# Patient Record
Sex: Female | Born: 1945 | Race: White | Hispanic: No | Smoking: Former smoker
Health system: Southern US, Community
[De-identification: ages and names within clinical notes are randomized; demographics above are authoritative.]

## PROBLEM LIST (undated history)

## (undated) DIAGNOSIS — M1990 Unspecified osteoarthritis, unspecified site: Secondary | ICD-10-CM

## (undated) DIAGNOSIS — E0590 Thyrotoxicosis, unspecified without thyrotoxic crisis or storm: Secondary | ICD-10-CM

## (undated) DIAGNOSIS — I10 Essential (primary) hypertension: Secondary | ICD-10-CM

## (undated) HISTORY — PX: HIP ARTHROSCOPY: SUR88

## (undated) HISTORY — PX: COLONOSCOPY: SHX174

## (undated) HISTORY — PX: KNEE ARTHROPLASTY: SHX992

## (undated) HISTORY — PX: OTHER SURGICAL HISTORY: SHX169

---

## 1997-12-31 ENCOUNTER — Encounter: Admission: RE | Admit: 1997-12-31 | Discharge: 1998-03-31 | Payer: Self-pay

## 1999-11-18 ENCOUNTER — Other Ambulatory Visit: Admission: RE | Admit: 1999-11-18 | Discharge: 1999-11-18 | Payer: Self-pay | Admitting: Obstetrics and Gynecology

## 2000-09-14 ENCOUNTER — Ambulatory Visit (HOSPITAL_BASED_OUTPATIENT_CLINIC_OR_DEPARTMENT_OTHER): Admission: RE | Admit: 2000-09-14 | Discharge: 2000-09-14 | Payer: Self-pay | Admitting: General Surgery

## 2000-12-19 ENCOUNTER — Other Ambulatory Visit: Admission: RE | Admit: 2000-12-19 | Discharge: 2000-12-19 | Payer: Self-pay | Admitting: Obstetrics and Gynecology

## 2002-01-21 ENCOUNTER — Encounter: Payer: Self-pay | Admitting: Obstetrics and Gynecology

## 2002-01-21 ENCOUNTER — Encounter: Admission: RE | Admit: 2002-01-21 | Discharge: 2002-01-21 | Payer: Self-pay | Admitting: Family Medicine

## 2002-03-07 ENCOUNTER — Other Ambulatory Visit: Admission: RE | Admit: 2002-03-07 | Discharge: 2002-03-07 | Payer: Self-pay | Admitting: Obstetrics and Gynecology

## 2003-01-17 ENCOUNTER — Encounter: Payer: Self-pay | Admitting: Emergency Medicine

## 2003-01-17 ENCOUNTER — Emergency Department (HOSPITAL_COMMUNITY): Admission: EM | Admit: 2003-01-17 | Discharge: 2003-01-17 | Payer: Self-pay | Admitting: Emergency Medicine

## 2003-05-27 ENCOUNTER — Other Ambulatory Visit: Admission: RE | Admit: 2003-05-27 | Discharge: 2003-05-27 | Payer: Self-pay | Admitting: Obstetrics and Gynecology

## 2004-06-16 ENCOUNTER — Encounter: Admission: RE | Admit: 2004-06-16 | Discharge: 2004-06-16 | Payer: Self-pay | Admitting: Family Medicine

## 2004-09-09 ENCOUNTER — Ambulatory Visit (HOSPITAL_COMMUNITY): Admission: RE | Admit: 2004-09-09 | Discharge: 2004-09-09 | Payer: Self-pay | Admitting: Gastroenterology

## 2005-12-05 ENCOUNTER — Encounter: Admission: RE | Admit: 2005-12-05 | Discharge: 2005-12-05 | Payer: Self-pay | Admitting: Orthopedic Surgery

## 2006-01-25 ENCOUNTER — Ambulatory Visit (HOSPITAL_COMMUNITY): Admission: RE | Admit: 2006-01-25 | Discharge: 2006-01-25 | Payer: Self-pay | Admitting: Orthopedic Surgery

## 2011-06-23 ENCOUNTER — Ambulatory Visit
Admission: RE | Admit: 2011-06-23 | Discharge: 2011-06-23 | Disposition: A | Discharge status: Home / Self Care | Payer: Medicare Other | Source: Ambulatory Visit | Attending: Family Medicine | Admitting: Family Medicine

## 2011-06-23 ENCOUNTER — Other Ambulatory Visit: Payer: Self-pay | Admitting: Family Medicine

## 2011-06-23 DIAGNOSIS — R52 Pain, unspecified: Secondary | ICD-10-CM

## 2014-09-17 ENCOUNTER — Other Ambulatory Visit: Payer: Self-pay | Admitting: Gastroenterology

## 2014-11-17 DIAGNOSIS — Z1231 Encounter for screening mammogram for malignant neoplasm of breast: Secondary | ICD-10-CM | POA: Diagnosis not present

## 2015-11-26 DIAGNOSIS — Z1231 Encounter for screening mammogram for malignant neoplasm of breast: Secondary | ICD-10-CM | POA: Diagnosis not present

## 2015-12-09 ENCOUNTER — Other Ambulatory Visit: Payer: Self-pay | Admitting: Family Medicine

## 2015-12-09 DIAGNOSIS — E049 Nontoxic goiter, unspecified: Secondary | ICD-10-CM

## 2015-12-13 ENCOUNTER — Ambulatory Visit
Admission: RE | Admit: 2015-12-13 | Discharge: 2015-12-13 | Disposition: A | Discharge status: Home / Self Care | Payer: Commercial Managed Care - HMO | Source: Ambulatory Visit | Attending: Family Medicine | Admitting: Family Medicine

## 2015-12-13 DIAGNOSIS — E049 Nontoxic goiter, unspecified: Secondary | ICD-10-CM

## 2015-12-15 ENCOUNTER — Other Ambulatory Visit: Payer: Self-pay | Admitting: Family Medicine

## 2015-12-15 DIAGNOSIS — E041 Nontoxic single thyroid nodule: Secondary | ICD-10-CM

## 2015-12-21 ENCOUNTER — Ambulatory Visit
Admission: RE | Admit: 2015-12-21 | Discharge: 2015-12-21 | Disposition: A | Discharge status: Home / Self Care | Payer: Commercial Managed Care - HMO | Source: Ambulatory Visit | Attending: Family Medicine | Admitting: Family Medicine

## 2015-12-21 ENCOUNTER — Other Ambulatory Visit (HOSPITAL_COMMUNITY)
Admission: RE | Admit: 2015-12-21 | Discharge: 2015-12-21 | Disposition: A | Discharge status: Home / Self Care | Payer: Commercial Managed Care - HMO | Source: Ambulatory Visit | Attending: Diagnostic Radiology | Admitting: Diagnostic Radiology

## 2015-12-21 DIAGNOSIS — E041 Nontoxic single thyroid nodule: Secondary | ICD-10-CM

## 2016-01-31 DIAGNOSIS — I10 Essential (primary) hypertension: Secondary | ICD-10-CM | POA: Diagnosis not present

## 2016-01-31 DIAGNOSIS — E785 Hyperlipidemia, unspecified: Secondary | ICD-10-CM | POA: Diagnosis not present

## 2016-01-31 DIAGNOSIS — Z0000 Encounter for general adult medical examination without abnormal findings: Secondary | ICD-10-CM | POA: Diagnosis not present

## 2016-01-31 DIAGNOSIS — Z1389 Encounter for screening for other disorder: Secondary | ICD-10-CM | POA: Diagnosis not present

## 2016-01-31 DIAGNOSIS — Z23 Encounter for immunization: Secondary | ICD-10-CM | POA: Diagnosis not present

## 2016-03-11 DIAGNOSIS — N390 Urinary tract infection, site not specified: Secondary | ICD-10-CM | POA: Diagnosis not present

## 2016-10-16 IMAGING — US US SOFT TISSUE HEAD/NECK
1 series · 14 of 25 positions shown · non-contrast
Comparison: None.

CLINICAL DATA: Thyroid goiter. Goiter extending into the
mediastinum based on outside CT.

EXAM:
THYROID ULTRASOUND
TECHNIQUE: Ultrasound examination of the thyroid gland and adjacent soft
tissues was performed.

[Series 1: us soft tissue head/neck · 0.08mm/px · 14 of 43 slices shown]
[im 1/43]
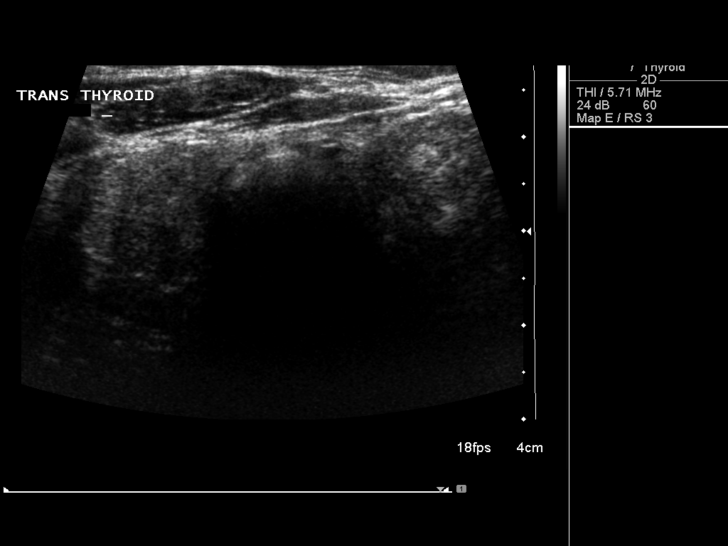
[im 4/43]
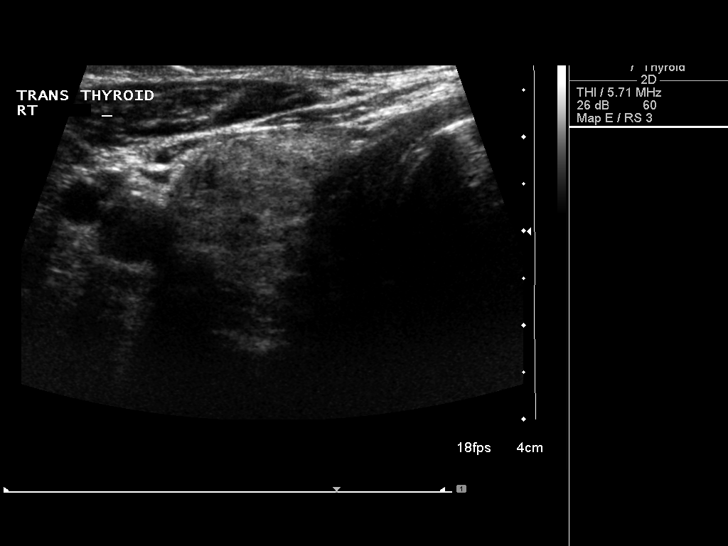
[im 8/43]
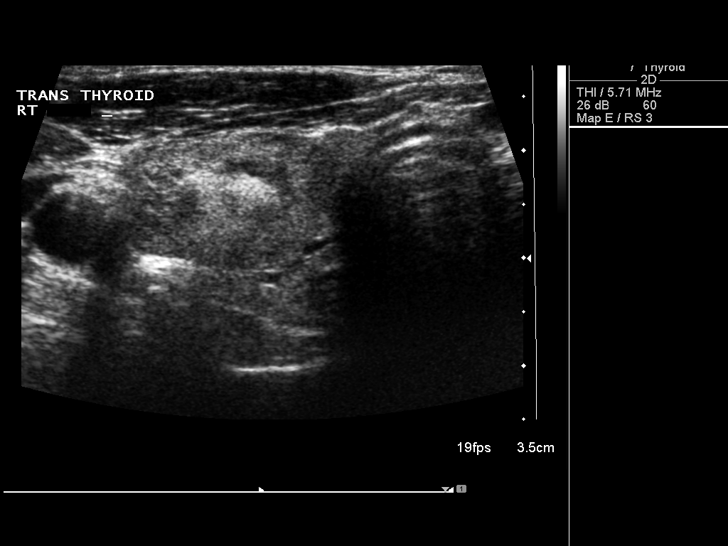
[im 11/43]
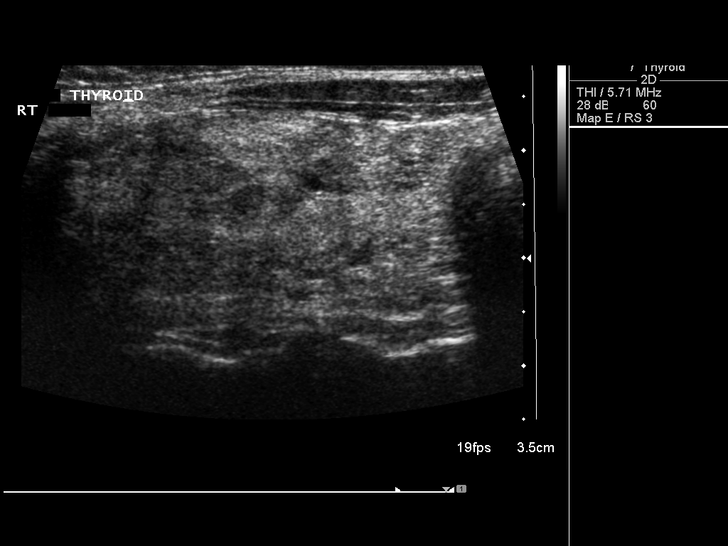
[im 15/43]
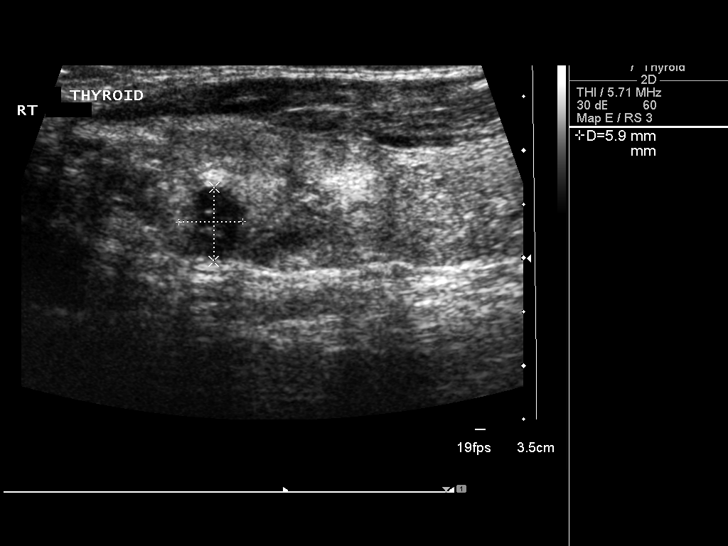
[im 16/43]
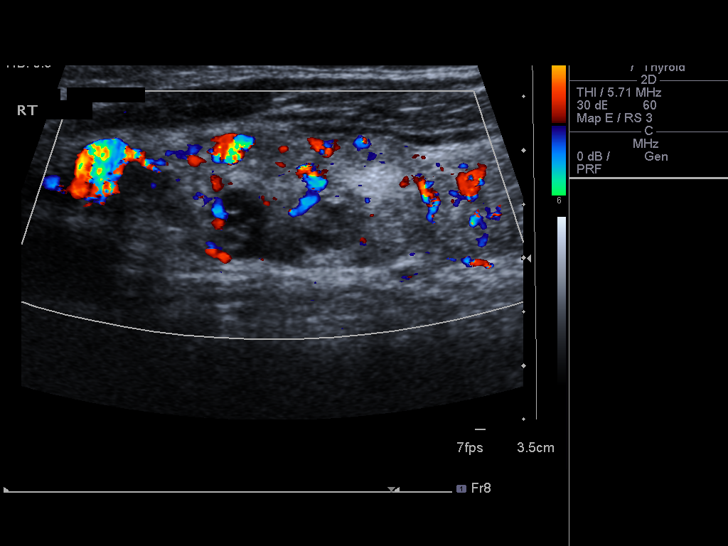
[im 20/43]
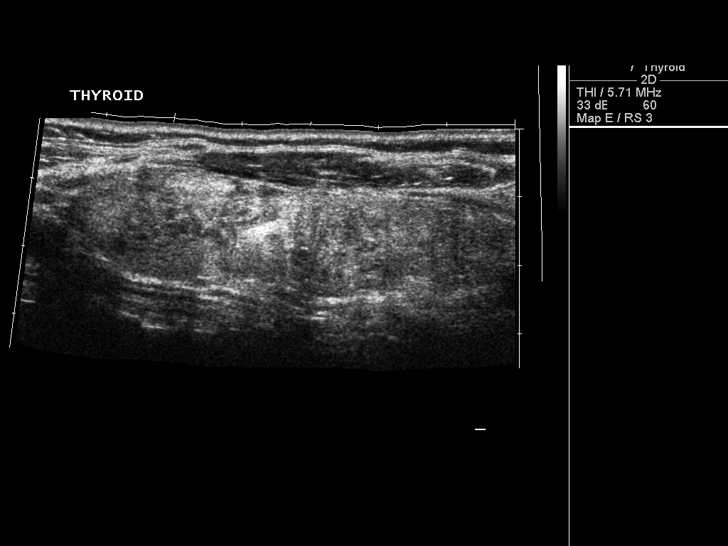
[im 23/43]
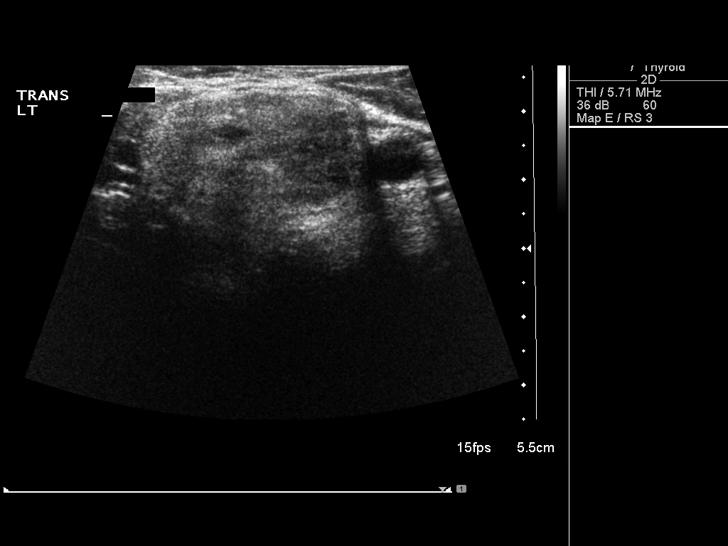
[im 27/43]
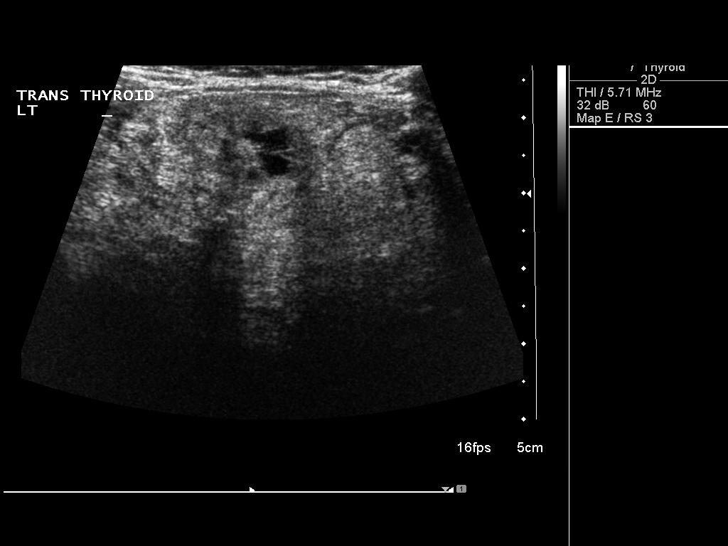
[im 29/43]
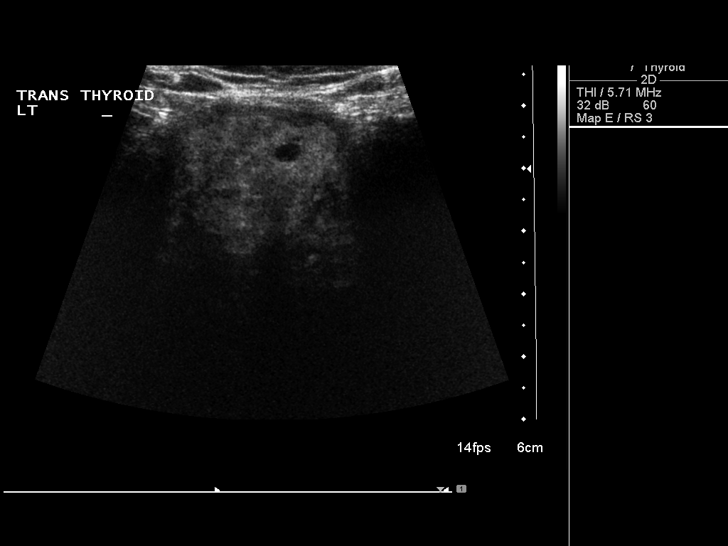
[im 32/43]
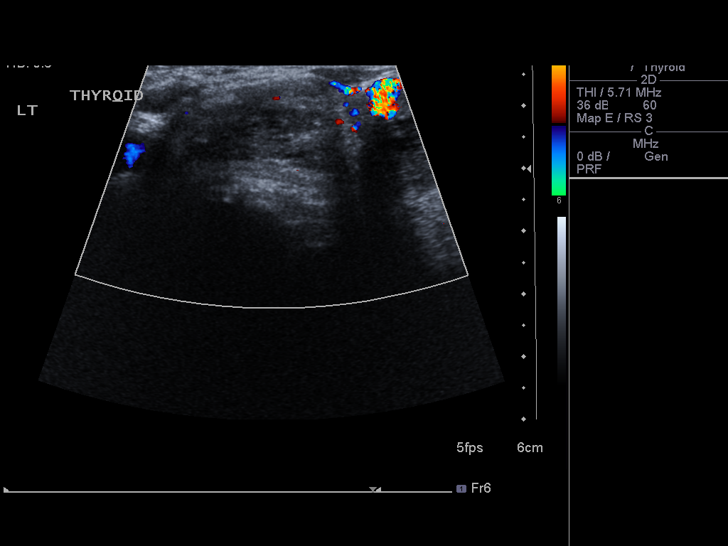
[im 36/43]
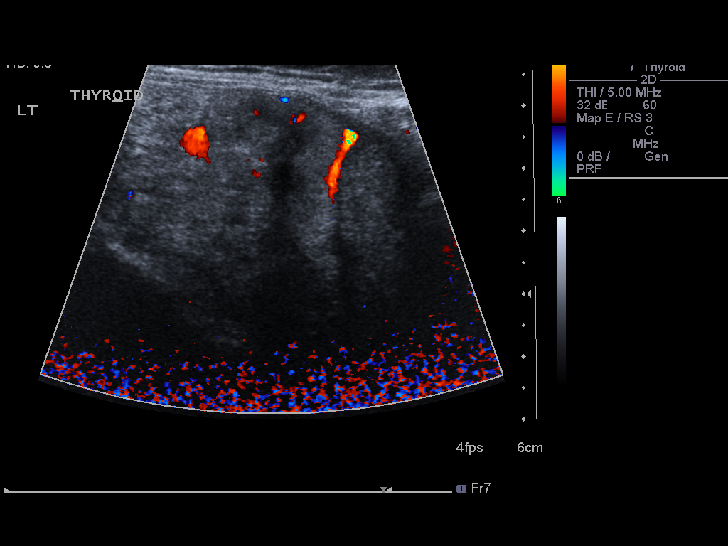
[im 39/43]
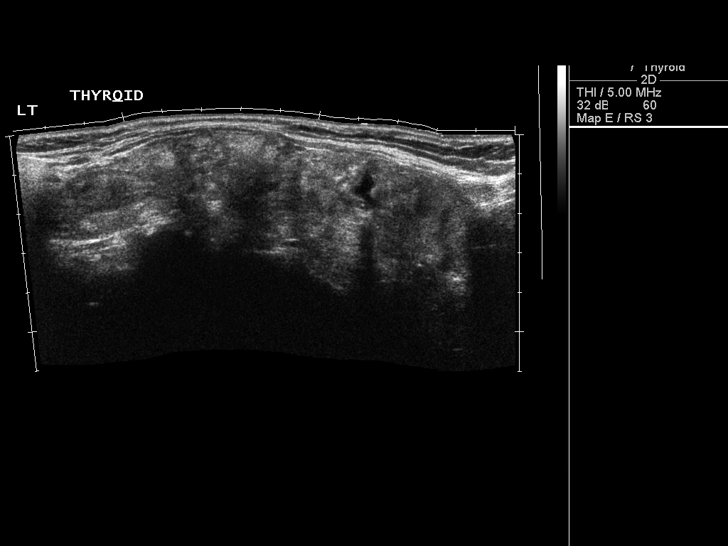
[im 43/43]
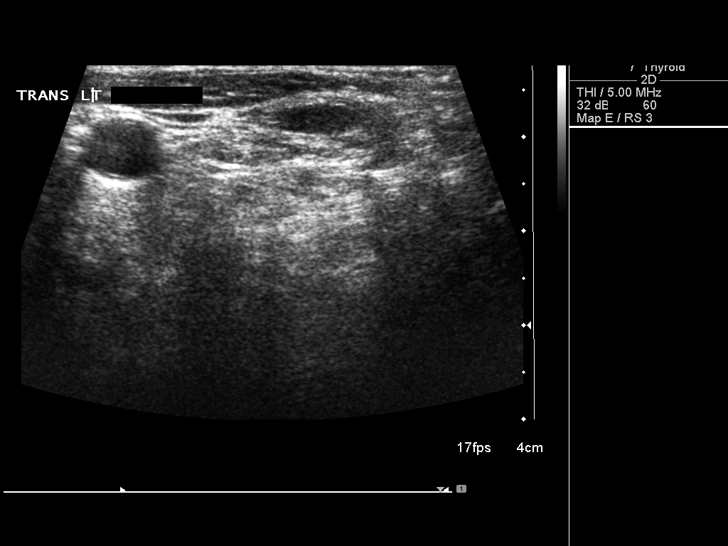

[14 of 25 positions shown; findings below may reference images not displayed]

FINDINGS: Right thyroid lobe

Measurements: 6.6 x 1.7 x 2.3 cm. Right thyroid tissue is diffusely
heterogeneous. There is a heterogeneous structure in the mid right
thyroid lobe which could represent a complex cyst measuring 0.6 x
0.6 x 0.7 cm.

Left thyroid lobe

Measurements: 11.3 x 4.3 x 5.2 cm. Left thyroid tissue is diffusely
heterogeneous and may be replaced by a large nodule or
conglomeration of nodules.

Isthmus

Thickness: 0.6 cm.  Heterogeneous without a discrete nodule.

Lymphadenopathy

None visualized.
IMPRESSION: Thyroid tissue is diffusely enlarged and heterogeneous, particularly
the left thyroid lobe. Left thyroid lobe may be replaced by nodules.
Consider ultrasound-guided biopsy of the left thyroid lobe.

## 2016-10-24 IMAGING — US US THYROID BIOPSY
1 series · 13 of 15 positions shown · non-contrast
Comparison: none

INDICATION: 70-year-old with an enlarged left thyroid lobe.

[Series 1: us thyroid biopsy · 0.09mm/px · 15 acquisitions, 13 frames shown]
[im 1/15]
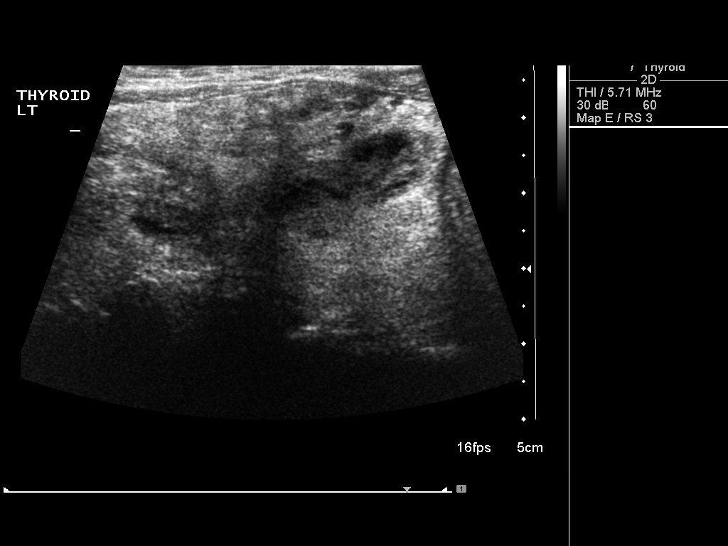
[im 2/15]
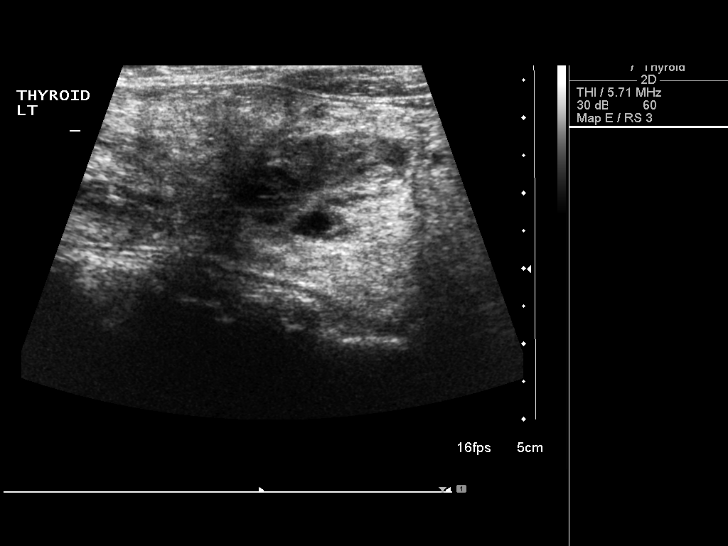
[im 3/15]
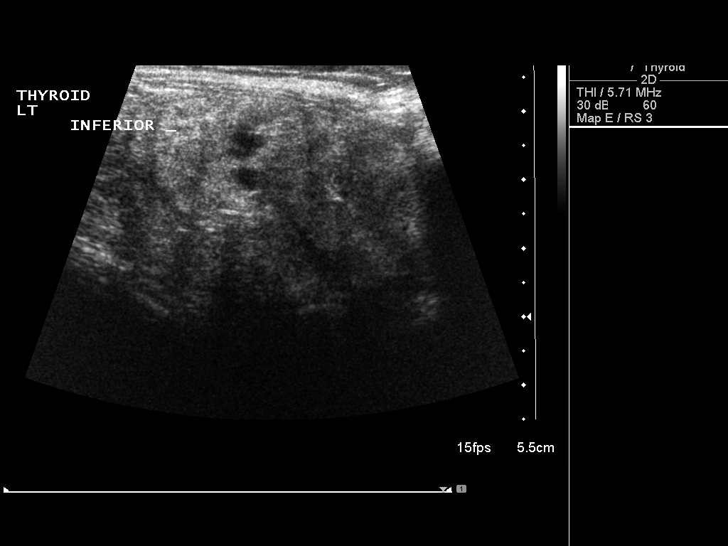
[im 5/15]
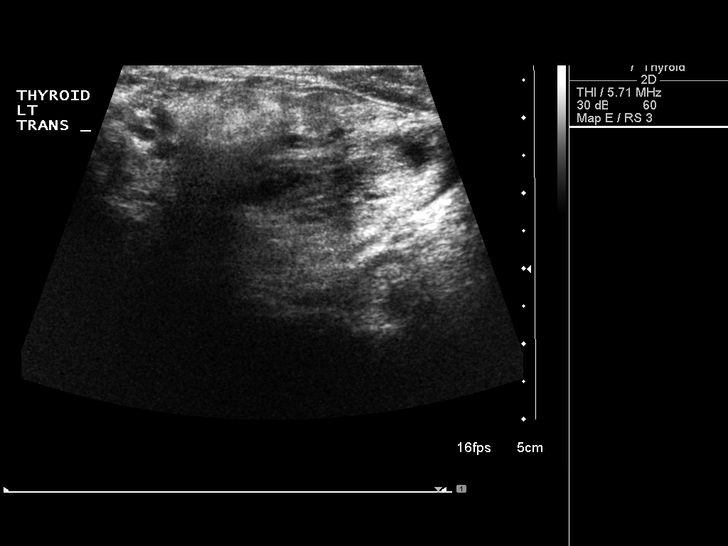
[im 6/15]
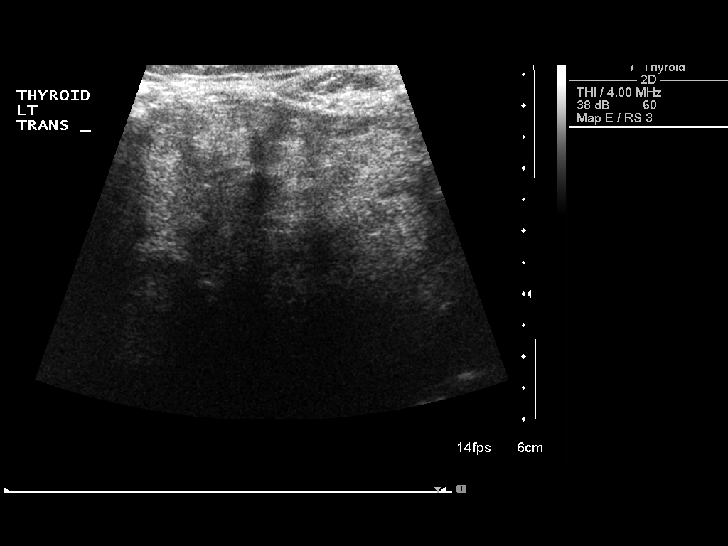
[im 7/15]
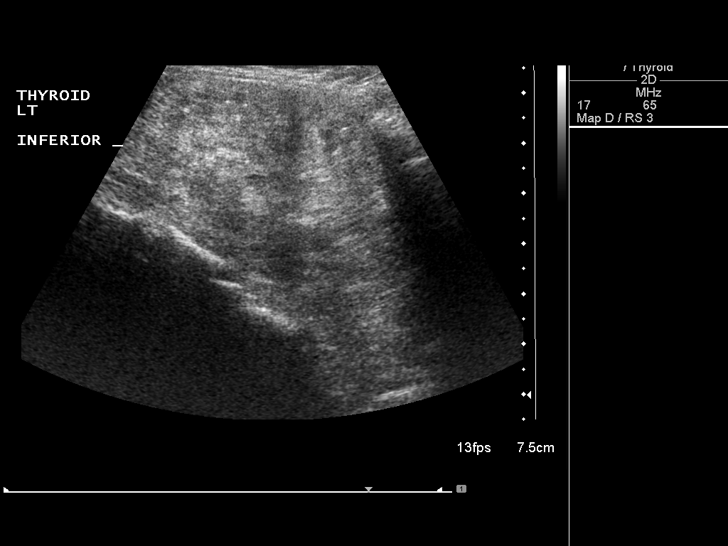
[im 8/15]
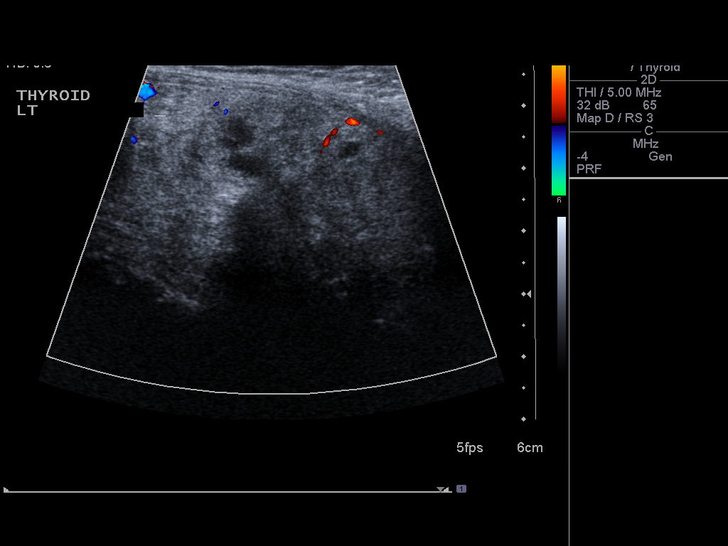
[im 9/15]
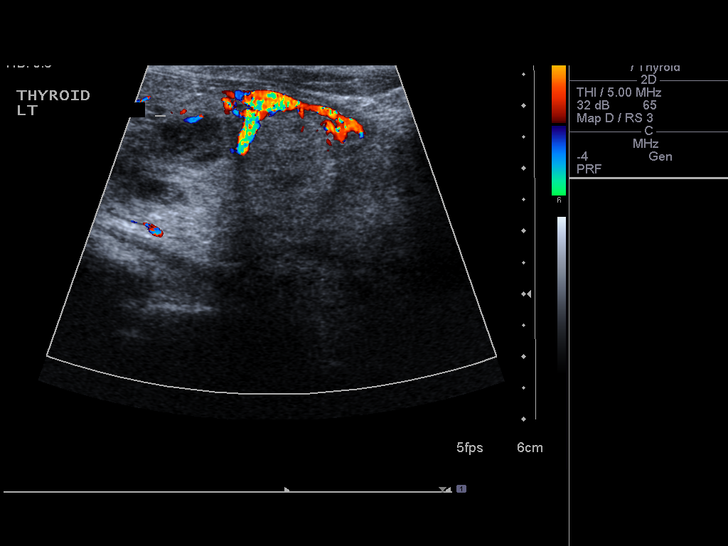
[im 10/15]
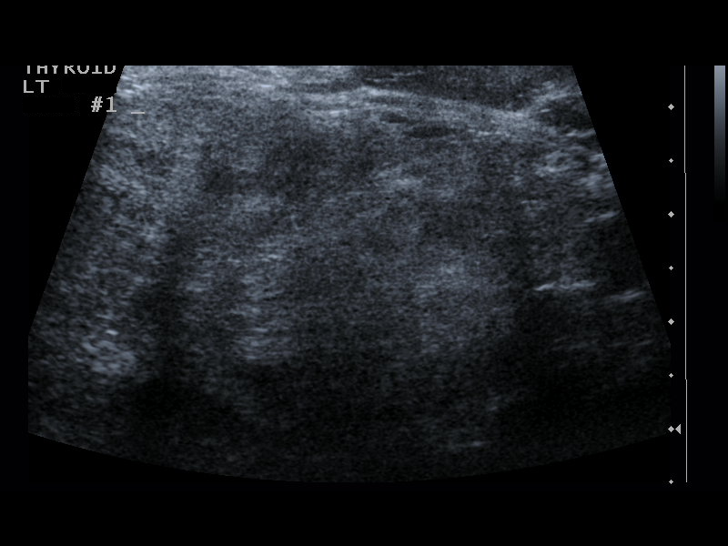
[im 11/15]
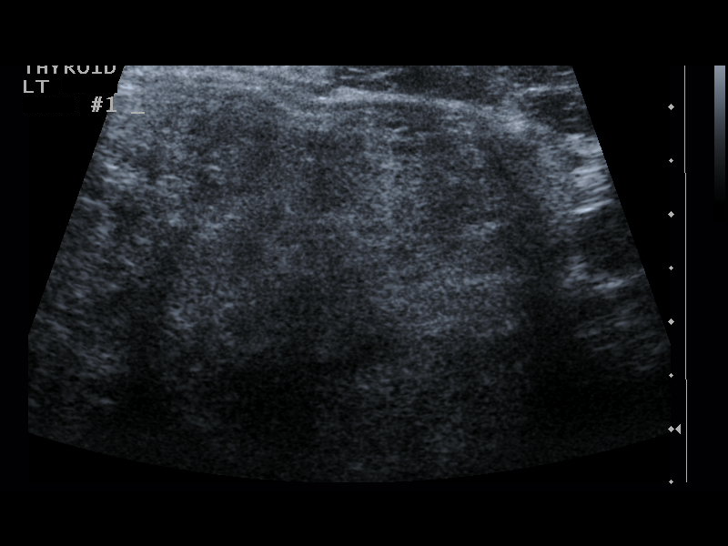
[im 13/15]
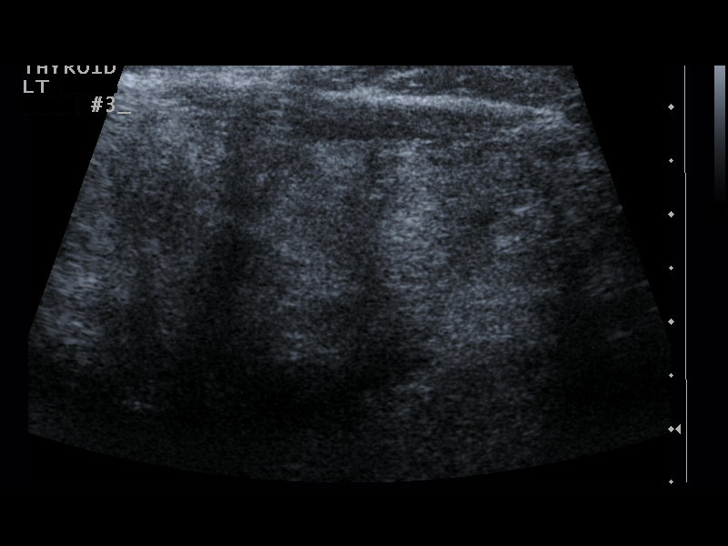
[im 14/15]
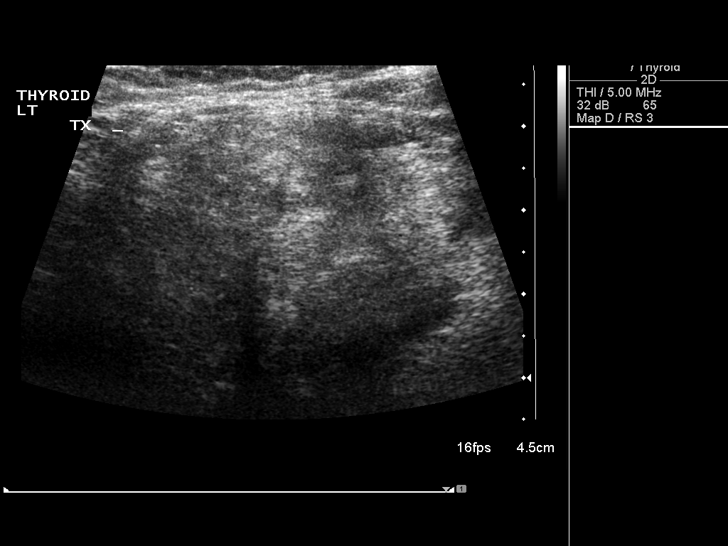
[im 15/15]
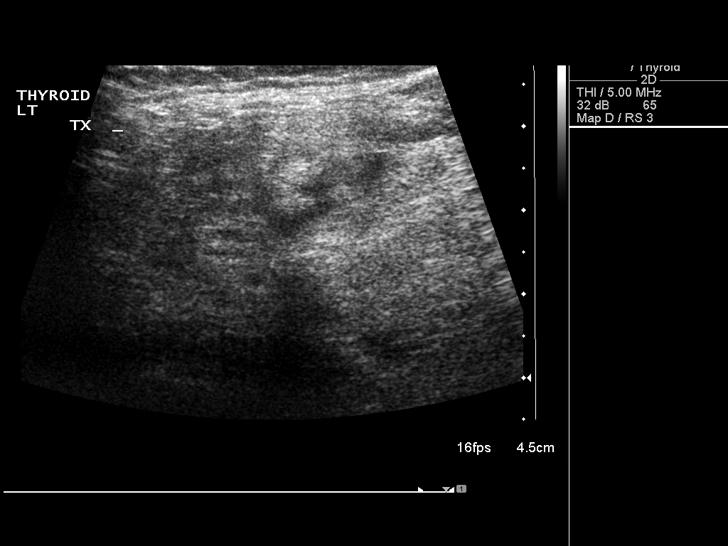

[13 of 15 positions shown; findings below may reference images not displayed]

EXAM:
ULTRASOUND GUIDED NEEDLE ASPIRATE BIOPSY OF THE THYROID GLAND

MEDICATIONS:
None.

ANESTHESIA/SEDATION:
None

FLUOROSCOPY TIME:  None

COMPLICATIONS:
None immediate.

PROCEDURE:
The procedure was explained to the patient. The risks and benefits
of the procedure were discussed and the patient's questions were
addressed. Informed consent was obtained from the patient.

Ultrasound was performed to localize and mark an adequate site for
the biopsy. The patient was then prepped and draped in a normal
sterile fashion. Local anesthesia was provided with 1% lidocaine.
Using direct ultrasound guidance, 4 passes were made using needles
into the left lobe of the thyroid. Ultrasound was used to confirm
needle placements on all occasions. Specimens were sent to Pathology
for analysis.
FINDINGS: Left thyroid lobe is diffusely heterogeneous and enlarged. Unclear
if the left thyroid lobe is diffusely heterogeneous or replaced by
multiple nodules. Biopsy samples were obtained from the more
inferior aspect of the left thyroid lobe.
IMPRESSION: Ultrasound-guided fine-needle aspiration of the left thyroid lobe.

## 2016-11-21 DIAGNOSIS — R0789 Other chest pain: Secondary | ICD-10-CM | POA: Diagnosis not present

## 2016-11-21 DIAGNOSIS — R112 Nausea with vomiting, unspecified: Secondary | ICD-10-CM | POA: Diagnosis not present

## 2016-11-22 ENCOUNTER — Inpatient Hospital Stay (HOSPITAL_COMMUNITY)
Admission: EM | Admit: 2016-11-22 | Discharge: 2016-11-24 | DRG: 419 | Disposition: A | Discharge status: Home / Self Care | Payer: Medicare HMO | Attending: General Surgery | Admitting: General Surgery

## 2016-11-22 ENCOUNTER — Observation Stay (HOSPITAL_COMMUNITY): Payer: Medicare HMO | Admitting: Anesthesiology

## 2016-11-22 ENCOUNTER — Emergency Department (HOSPITAL_COMMUNITY): Payer: Medicare HMO

## 2016-11-22 ENCOUNTER — Encounter: Payer: Self-pay | Admitting: Emergency Medicine

## 2016-11-22 ENCOUNTER — Encounter (HOSPITAL_COMMUNITY): Admission: EM | Disposition: A | Discharge status: Home / Self Care | Payer: Self-pay | Source: Home / Self Care

## 2016-11-22 DIAGNOSIS — Z882 Allergy status to sulfonamides status: Secondary | ICD-10-CM | POA: Diagnosis not present

## 2016-11-22 DIAGNOSIS — K8012 Calculus of gallbladder with acute and chronic cholecystitis without obstruction: Secondary | ICD-10-CM | POA: Diagnosis not present

## 2016-11-22 DIAGNOSIS — K8066 Calculus of gallbladder and bile duct with acute and chronic cholecystitis without obstruction: Secondary | ICD-10-CM | POA: Diagnosis not present

## 2016-11-22 DIAGNOSIS — Z87891 Personal history of nicotine dependence: Secondary | ICD-10-CM

## 2016-11-22 DIAGNOSIS — R1011 Right upper quadrant pain: Secondary | ICD-10-CM

## 2016-11-22 DIAGNOSIS — I10 Essential (primary) hypertension: Secondary | ICD-10-CM | POA: Diagnosis present

## 2016-11-22 DIAGNOSIS — Z96649 Presence of unspecified artificial hip joint: Secondary | ICD-10-CM | POA: Diagnosis present

## 2016-11-22 DIAGNOSIS — K810 Acute cholecystitis: Principal | ICD-10-CM | POA: Diagnosis present

## 2016-11-22 DIAGNOSIS — Z79899 Other long term (current) drug therapy: Secondary | ICD-10-CM

## 2016-11-22 DIAGNOSIS — K8020 Calculus of gallbladder without cholecystitis without obstruction: Secondary | ICD-10-CM | POA: Diagnosis not present

## 2016-11-22 DIAGNOSIS — Z888 Allergy status to other drugs, medicaments and biological substances status: Secondary | ICD-10-CM | POA: Diagnosis not present

## 2016-11-22 DIAGNOSIS — K819 Cholecystitis, unspecified: Secondary | ICD-10-CM | POA: Diagnosis present

## 2016-11-22 HISTORY — DX: Essential (primary) hypertension: I10

## 2016-11-22 HISTORY — PX: CHOLECYSTECTOMY: SHX55

## 2016-11-22 LAB — URINALYSIS, ROUTINE W REFLEX MICROSCOPIC
GLUCOSE, UA: 50 mg/dL — AB
KETONES UR: NEGATIVE mg/dL
LEUKOCYTES UA: NEGATIVE
Nitrite: NEGATIVE
PH: 5 (ref 5.0–8.0)
Protein, ur: 100 mg/dL — AB
Specific Gravity, Urine: 1.02 (ref 1.005–1.030)

## 2016-11-22 LAB — COMPREHENSIVE METABOLIC PANEL
ALT: 16 U/L (ref 14–54)
AST: 18 U/L (ref 15–41)
Albumin: 3.2 g/dL — ABNORMAL LOW (ref 3.5–5.0)
Alkaline Phosphatase: 46 U/L (ref 38–126)
Anion gap: 13 (ref 5–15)
BUN: 24 mg/dL — AB (ref 6–20)
CHLORIDE: 99 mmol/L — AB (ref 101–111)
CO2: 22 mmol/L (ref 22–32)
CREATININE: 1.27 mg/dL — AB (ref 0.44–1.00)
Calcium: 8.9 mg/dL (ref 8.9–10.3)
GFR calc Af Amer: 48 mL/min — ABNORMAL LOW (ref >60)
GFR calc non Af Amer: 41 mL/min — ABNORMAL LOW (ref >60)
GLUCOSE: 139 mg/dL — AB (ref 65–99)
POTASSIUM: 3.5 mmol/L (ref 3.5–5.1)
SODIUM: 134 mmol/L — AB (ref 135–145)
Total Bilirubin: 1 mg/dL (ref 0.3–1.2)
Total Protein: 6.6 g/dL (ref 6.5–8.1)

## 2016-11-22 LAB — CBC
HEMATOCRIT: 38.5 % (ref 36.0–46.0)
Hemoglobin: 13.2 g/dL (ref 12.0–15.0)
MCH: 28.9 pg (ref 26.0–34.0)
MCHC: 34.3 g/dL (ref 30.0–36.0)
MCV: 84.2 fL (ref 78.0–100.0)
Platelets: 477 10*3/uL — ABNORMAL HIGH (ref 150–400)
RBC: 4.57 MIL/uL (ref 3.87–5.11)
RDW: 13.3 % (ref 11.5–15.5)
WBC: 19.8 10*3/uL — AB (ref 4.0–10.5)

## 2016-11-22 LAB — LIPASE, BLOOD: LIPASE: 18 U/L (ref 11–51)

## 2016-11-22 SURGERY — LAPAROSCOPIC CHOLECYSTECTOMY
Anesthesia: General | Site: Abdomen

## 2016-11-22 MED ORDER — MORPHINE SULFATE (PF) 4 MG/ML IV SOLN: 2.0000 mg | INTRAVENOUS | Status: DC | PRN

## 2016-11-22 MED ORDER — ONDANSETRON HCL 4 MG/2ML IJ SOLN
INTRAMUSCULAR | Status: AC
  Filled 2016-11-22: qty 2

## 2016-11-22 MED ORDER — SCOPOLAMINE 1 MG/3DAYS TD PT72
1.0000 | MEDICATED_PATCH | TRANSDERMAL | Status: AC
  Administered 2016-11-22: 1 via TRANSDERMAL
  Filled 2016-11-22: qty 1

## 2016-11-22 MED ORDER — SODIUM CHLORIDE 0.9 % IV SOLN: INTRAVENOUS | Status: DC

## 2016-11-22 MED ORDER — ONDANSETRON 4 MG PO TBDP: 4.0000 mg | ORAL_TABLET | Freq: Four times a day (QID) | ORAL | Status: DC | PRN

## 2016-11-22 MED ORDER — ONDANSETRON HCL 4 MG/2ML IJ SOLN
4.0000 mg | Freq: Once | INTRAMUSCULAR | Status: AC
  Administered 2016-11-22: 4 mg via INTRAVENOUS
  Filled 2016-11-22: qty 2

## 2016-11-22 MED ORDER — FENTANYL CITRATE (PF) 100 MCG/2ML IJ SOLN
INTRAMUSCULAR | Status: AC
  Filled 2016-11-22: qty 4

## 2016-11-22 MED ORDER — ONDANSETRON HCL 4 MG/2ML IJ SOLN: 4.0000 mg | Freq: Four times a day (QID) | INTRAMUSCULAR | Status: DC | PRN

## 2016-11-22 MED ORDER — HYDRALAZINE HCL 20 MG/ML IJ SOLN
10.0000 mg | INTRAMUSCULAR | Status: DC | PRN
Start: 2016-11-22 — End: 2016-11-24

## 2016-11-22 MED ORDER — LACTATED RINGERS IV SOLN
INTRAVENOUS | Status: DC | PRN
Start: 2016-11-22 — End: 2016-11-22
  Administered 2016-11-22: 17:00:00 via INTRAVENOUS

## 2016-11-22 MED ORDER — FENTANYL CITRATE (PF) 100 MCG/2ML IJ SOLN
INTRAMUSCULAR | Status: AC
  Filled 2016-11-22: qty 2

## 2016-11-22 MED ORDER — PROPOFOL 10 MG/ML IV BOLUS
INTRAVENOUS | Status: AC
  Filled 2016-11-22: qty 20

## 2016-11-22 MED ORDER — PHENYLEPHRINE HCL 10 MG/ML IJ SOLN
INTRAMUSCULAR | Status: DC | PRN
  Administered 2016-11-22 (×3): 80 ug via INTRAVENOUS

## 2016-11-22 MED ORDER — IOPAMIDOL (ISOVUE-300) INJECTION 61%
INTRAVENOUS | Status: AC
  Filled 2016-11-22: qty 50

## 2016-11-22 MED ORDER — PHENYLEPHRINE HCL 10 MG/ML IJ SOLN
INTRAVENOUS | Status: DC | PRN
  Administered 2016-11-22: 50 ug/min via INTRAVENOUS

## 2016-11-22 MED ORDER — BUPIVACAINE-EPINEPHRINE (PF) 0.5% -1:200000 IJ SOLN
INTRAMUSCULAR | Status: DC | PRN
  Administered 2016-11-22: 20 mL via PERINEURAL

## 2016-11-22 MED ORDER — PROMETHAZINE HCL 25 MG/ML IJ SOLN: 6.2500 mg | INTRAMUSCULAR | Status: DC | PRN

## 2016-11-22 MED ORDER — MIDAZOLAM HCL 2 MG/2ML IJ SOLN: 0.5000 mg | Freq: Once | INTRAMUSCULAR | Status: DC | PRN

## 2016-11-22 MED ORDER — HYDROMORPHONE HCL 1 MG/ML IJ SOLN
INTRAMUSCULAR | Status: AC
Start: 2016-11-22 — End: 2016-11-23
  Filled 2016-11-22: qty 0.5

## 2016-11-22 MED ORDER — DEXTROSE 5 % IV SOLN: 2.0000 g | INTRAVENOUS | Status: DC

## 2016-11-22 MED ORDER — SUGAMMADEX SODIUM 200 MG/2ML IV SOLN
INTRAVENOUS | Status: DC | PRN
Start: 2016-11-22 — End: 2016-11-22
  Administered 2016-11-22: 150 mg via INTRAVENOUS

## 2016-11-22 MED ORDER — PIPERACILLIN-TAZOBACTAM 3.375 G IVPB
3.3750 g | INTRAVENOUS | Status: AC
  Administered 2016-11-22: 3.375 g via INTRAVENOUS
  Filled 2016-11-22: qty 50

## 2016-11-22 MED ORDER — ACETAMINOPHEN 325 MG PO TABS
650.0000 mg | ORAL_TABLET | Freq: Four times a day (QID) | ORAL | Status: DC | PRN
Start: 2016-11-22 — End: 2016-11-24

## 2016-11-22 MED ORDER — LIDOCAINE 2% (20 MG/ML) 5 ML SYRINGE
INTRAMUSCULAR | Status: DC | PRN
  Administered 2016-11-22: 100 mg via INTRAVENOUS

## 2016-11-22 MED ORDER — SODIUM CHLORIDE 0.9 % IV BOLUS (SEPSIS)
1000.0000 mL | Freq: Once | INTRAVENOUS | Status: AC
  Administered 2016-11-22: 1000 mL via INTRAVENOUS

## 2016-11-22 MED ORDER — DIPHENHYDRAMINE HCL 50 MG/ML IJ SOLN: 12.5000 mg | Freq: Four times a day (QID) | INTRAMUSCULAR | Status: DC | PRN

## 2016-11-22 MED ORDER — FENTANYL CITRATE (PF) 100 MCG/2ML IJ SOLN
INTRAMUSCULAR | Status: DC | PRN
  Administered 2016-11-22 (×2): 100 ug via INTRAVENOUS
  Administered 2016-11-22: 25 ug via INTRAVENOUS
  Administered 2016-11-22: 100 ug via INTRAVENOUS

## 2016-11-22 MED ORDER — SUGAMMADEX SODIUM 200 MG/2ML IV SOLN
INTRAVENOUS | Status: AC
  Filled 2016-11-22: qty 2

## 2016-11-22 MED ORDER — SCOPOLAMINE 1 MG/3DAYS TD PT72
MEDICATED_PATCH | TRANSDERMAL | Status: AC
  Filled 2016-11-22: qty 1

## 2016-11-22 MED ORDER — SODIUM CHLORIDE 0.9 % IV SOLN
INTRAVENOUS | Status: DC
  Administered 2016-11-22: 16:00:00 via INTRAVENOUS
  Administered 2016-11-22: 150 mL/h via INTRAVENOUS

## 2016-11-22 MED ORDER — DIPHENHYDRAMINE HCL 12.5 MG/5ML PO ELIX: 12.5000 mg | ORAL_SOLUTION | Freq: Four times a day (QID) | ORAL | Status: DC | PRN

## 2016-11-22 MED ORDER — PIPERACILLIN-TAZOBACTAM 3.375 G IVPB
3.3750 g | Freq: Three times a day (TID) | INTRAVENOUS | Status: DC
  Administered 2016-11-22 – 2016-11-24 (×5): 3.375 g via INTRAVENOUS
  Filled 2016-11-22 (×6): qty 50

## 2016-11-22 MED ORDER — ACETAMINOPHEN 650 MG RE SUPP: 650.0000 mg | Freq: Four times a day (QID) | RECTAL | Status: DC | PRN

## 2016-11-22 MED ORDER — CEFAZOLIN SODIUM 1 G IJ SOLR
INTRAMUSCULAR | Status: AC
  Filled 2016-11-22: qty 20

## 2016-11-22 MED ORDER — MEPERIDINE HCL 25 MG/ML IJ SOLN: 6.2500 mg | INTRAMUSCULAR | Status: DC | PRN

## 2016-11-22 MED ORDER — 0.9 % SODIUM CHLORIDE (POUR BTL) OPTIME
TOPICAL | Status: DC | PRN
  Administered 2016-11-22: 1000 mL

## 2016-11-22 MED ORDER — PROPOFOL 10 MG/ML IV BOLUS
INTRAVENOUS | Status: DC | PRN
  Administered 2016-11-22: 150 mg via INTRAVENOUS

## 2016-11-22 MED ORDER — ROCURONIUM BROMIDE 10 MG/ML (PF) SYRINGE
PREFILLED_SYRINGE | INTRAVENOUS | Status: DC | PRN
  Administered 2016-11-22: 50 mg via INTRAVENOUS

## 2016-11-22 MED ORDER — AMLODIPINE BESYLATE 5 MG PO TABS
5.0000 mg | ORAL_TABLET | Freq: Every day | ORAL | Status: DC
  Administered 2016-11-23: 5 mg via ORAL
  Filled 2016-11-22: qty 1

## 2016-11-22 MED ORDER — HYDROCODONE-ACETAMINOPHEN 5-325 MG PO TABS
1.0000 | ORAL_TABLET | ORAL | Status: DC | PRN
  Administered 2016-11-22 – 2016-11-23 (×2): 2 via ORAL
  Filled 2016-11-22 (×2): qty 2

## 2016-11-22 MED ORDER — ONDANSETRON HCL 4 MG/2ML IJ SOLN
INTRAMUSCULAR | Status: DC | PRN
  Administered 2016-11-22: 4 mg via INTRAVENOUS

## 2016-11-22 MED ORDER — FENTANYL CITRATE (PF) 100 MCG/2ML IJ SOLN
100.0000 ug | Freq: Once | INTRAMUSCULAR | Status: AC
  Administered 2016-11-22: 100 ug via INTRAVENOUS
  Filled 2016-11-22: qty 2

## 2016-11-22 MED ORDER — PANTOPRAZOLE SODIUM 40 MG IV SOLR
40.0000 mg | Freq: Every day | INTRAVENOUS | Status: DC
  Administered 2016-11-22: 40 mg via INTRAVENOUS
  Filled 2016-11-22: qty 40

## 2016-11-22 MED ORDER — BUPIVACAINE HCL (PF) 0.25 % IJ SOLN
INTRAMUSCULAR | Status: AC
  Filled 2016-11-22: qty 30

## 2016-11-22 MED ORDER — SODIUM CHLORIDE 0.9 % IR SOLN
Status: DC | PRN
Start: 2016-11-22 — End: 2016-11-22
  Administered 2016-11-22: 2000 mL

## 2016-11-22 MED ORDER — HYDROMORPHONE HCL 1 MG/ML IJ SOLN
0.2500 mg | INTRAMUSCULAR | Status: DC | PRN
  Administered 2016-11-22: 0.5 mg via INTRAVENOUS

## 2016-11-22 MED ORDER — BUPIVACAINE-EPINEPHRINE (PF) 0.5% -1:200000 IJ SOLN
INTRAMUSCULAR | Status: AC
  Filled 2016-11-22: qty 30

## 2016-11-22 SURGICAL SUPPLY — 62 items
ADH SKN CLS APL DERMABOND .7 (GAUZE/BANDAGES/DRESSINGS) ×6
APPLIER CLIP 5 13 M/L LIGAMAX5 (MISCELLANEOUS) ×4
APR CLP MED LRG 5 ANG JAW (MISCELLANEOUS) ×2
BAG SPEC RTRVL 10 TROC 200 (ENDOMECHANICALS) ×2
BLADE SURG ROTATE 9660 (MISCELLANEOUS) IMPLANT
CANISTER SUCTION 2500CC (MISCELLANEOUS) ×4 IMPLANT
CHLORAPREP W/TINT 26ML (MISCELLANEOUS) ×4 IMPLANT
CLIP APPLIE 5 13 M/L LIGAMAX5 (MISCELLANEOUS) ×2 IMPLANT
CLOSURE STERI-STRIP 1/2X4 (GAUZE/BANDAGES/DRESSINGS) ×3
CLOSURE WOUND 1/2 X4 (GAUZE/BANDAGES/DRESSINGS) ×1
CLSR STERI-STRIP ANTIMIC 1/2X4 (GAUZE/BANDAGES/DRESSINGS) ×6 IMPLANT
COVER MAYO STAND STRL (DRAPES) IMPLANT
COVER SURGICAL LIGHT HANDLE (MISCELLANEOUS) ×4 IMPLANT
DERMABOND ADVANCED (GAUZE/BANDAGES/DRESSINGS) ×6
DERMABOND ADVANCED .7 DNX12 (GAUZE/BANDAGES/DRESSINGS) ×4 IMPLANT
DRAPE C-ARM 42X72 X-RAY (DRAPES) ×3 IMPLANT
DRSG TEGADERM 2-3/8X2-3/4 SM (GAUZE/BANDAGES/DRESSINGS) ×13 IMPLANT
ELECT REM PT RETURN 9FT ADLT (ELECTROSURGICAL) ×4
ELECTRODE REM PT RTRN 9FT ADLT (ELECTROSURGICAL) ×2 IMPLANT
GAUZE SPONGE 2X2 8PLY STRL LF (GAUZE/BANDAGES/DRESSINGS) ×3 IMPLANT
GLOVE BIO SURGEON STRL SZ 6.5 (GLOVE) ×2 IMPLANT
GLOVE BIO SURGEON STRL SZ7 (GLOVE) ×3 IMPLANT
GLOVE BIO SURGEON STRL SZ7.5 (GLOVE) ×6 IMPLANT
GLOVE BIO SURGEONS STRL SZ 6.5 (GLOVE) ×1
GLOVE BIOGEL PI IND STRL 6.5 (GLOVE) ×1 IMPLANT
GLOVE BIOGEL PI IND STRL 7.0 (GLOVE) ×1 IMPLANT
GLOVE BIOGEL PI IND STRL 7.5 (GLOVE) ×1 IMPLANT
GLOVE BIOGEL PI IND STRL 8 (GLOVE) ×2 IMPLANT
GLOVE BIOGEL PI INDICATOR 6.5 (GLOVE) ×2
GLOVE BIOGEL PI INDICATOR 7.0 (GLOVE) ×2
GLOVE BIOGEL PI INDICATOR 7.5 (GLOVE) ×2
GLOVE BIOGEL PI INDICATOR 8 (GLOVE) ×2
GLOVE ECLIPSE 6.5 STRL STRAW (GLOVE) ×3 IMPLANT
GLOVE ECLIPSE 7.5 STRL STRAW (GLOVE) ×4 IMPLANT
GLOVE SURG SS PI 6.5 STRL IVOR (GLOVE) ×3 IMPLANT
GLOVE SURG SS PI 7.0 STRL IVOR (GLOVE) ×3 IMPLANT
GOWN STRL REUS W/ TWL LRG LVL3 (GOWN DISPOSABLE) ×9 IMPLANT
GOWN STRL REUS W/TWL LRG LVL3 (GOWN DISPOSABLE) ×24
KIT BASIN OR (CUSTOM PROCEDURE TRAY) ×4 IMPLANT
KIT ROOM TURNOVER OR (KITS) ×4 IMPLANT
L-HOOK LAP DISP 36CM (ELECTROSURGICAL) ×4
LHOOK LAP DISP 36CM (ELECTROSURGICAL) ×2 IMPLANT
NS IRRIG 1000ML POUR BTL (IV SOLUTION) ×4 IMPLANT
PAD ARMBOARD 7.5X6 YLW CONV (MISCELLANEOUS) ×4 IMPLANT
PENCIL BUTTON HOLSTER BLD 10FT (ELECTRODE) ×4 IMPLANT
POUCH RETRIEVAL ECOSAC 10 (ENDOMECHANICALS) ×2 IMPLANT
POUCH RETRIEVAL ECOSAC 10MM (ENDOMECHANICALS) ×2
SCISSORS LAP 5X35 DISP (ENDOMECHANICALS) ×4 IMPLANT
SET CHOLANGIOGRAPH 5 50 .035 (SET/KITS/TRAYS/PACK) IMPLANT
SET IRRIG TUBING LAPAROSCOPIC (IRRIGATION / IRRIGATOR) ×4 IMPLANT
SLEEVE ENDOPATH XCEL 5M (ENDOMECHANICALS) ×8 IMPLANT
SPECIMEN JAR SMALL (MISCELLANEOUS) ×4 IMPLANT
SPONGE GAUZE 2X2 STER 10/PKG (GAUZE/BANDAGES/DRESSINGS) ×6
STRIP CLOSURE SKIN 1/2X4 (GAUZE/BANDAGES/DRESSINGS) ×3 IMPLANT
SUT MNCRL AB 4-0 PS2 18 (SUTURE) ×4 IMPLANT
SUT VICRYL 0 UR6 27IN ABS (SUTURE) ×3 IMPLANT
TOWEL OR 17X24 6PK STRL BLUE (TOWEL DISPOSABLE) ×4 IMPLANT
TOWEL OR 17X26 10 PK STRL BLUE (TOWEL DISPOSABLE) ×1 IMPLANT
TRAY LAPAROSCOPIC MC (CUSTOM PROCEDURE TRAY) ×4 IMPLANT
TROCAR XCEL BLUNT TIP 100MML (ENDOMECHANICALS) ×4 IMPLANT
TROCAR XCEL NON-BLD 5MMX100MML (ENDOMECHANICALS) ×4 IMPLANT
TUBING INSUFFLATION (TUBING) ×4 IMPLANT

## 2016-11-22 NOTE — Anesthesia Procedure Notes (Signed)
Procedure Name: Intubation Date/Time: 11/22/2016 3:56 PM Performed by: Rebekah Chesterfield L Pre-anesthesia Checklist: Patient identified, Emergency Drugs available, Suction available and Patient being monitored Patient Re-evaluated:Patient Re-evaluated prior to inductionOxygen Delivery Method: Circle System Utilized Preoxygenation: Pre-oxygenation with 100% oxygen Intubation Type: IV induction Ventilation: Mask ventilation without difficulty Laryngoscope Size: Mac and 3 Grade View: Grade II Tube type: Oral Number of attempts: 1 Airway Equipment and Method: Stylet and Oral airway Placement Confirmation: ETT inserted through vocal cords under direct vision,  positive ETCO2 and breath sounds checked- equal and bilateral Secured at: 20 cm Tube secured with: Tape Dental Injury: Teeth and Oropharynx as per pre-operative assessment

## 2016-11-22 NOTE — Progress Notes (Signed)
Patient arrived from PACU to 6n23 alert and oriented, walked to bathroom with walker, IV intact, mild pain, 4 incisions noted to abdomen. Report received from PACU nurse Shanon Brow. On Ra, no concerns at the moment, family in waiting area. Will continue to monitor.

## 2016-11-22 NOTE — ED Triage Notes (Signed)
Pt c/o n/v onset x 2 days ago, pt seen at PCP yesterday with reported elevated WBC, pt sent home with phenergan, pt reports no improvement of symptoms, pt c/o RUQ abd pain, pt A&O x4

## 2016-11-22 NOTE — H&P (Signed)
[facility] Surgery Consult/Admission Note  Suzanne Trevino 1946-02-10  [id number].    Requesting MD: Dr. Dayna Barker Chief Complaint/Reason for Consult: Cholecystitis   HPI:   Patient is a 71 year old female with a history of hypertension, 2 cesarean sections, hip replacement who presented to the [facility] ED with complaints of right upper quadrant abdominal pain nausea and vomiting for 2 days. Patient states on Monday afternoon she had belly pain and vomiting after eating [employer]. Her pain improved. Yesterday she ate pizza. This morning early she woke with severe right upper quadrant, nonradiating, constant abdominal pain. She did not take anything for the pain. She saw her PCP yesterday who prescribed Phenergan for her nausea and vomiting. Patient has never had similar pain before. Nothing made it better. She denies fever, chills, hematemesis, diarrhea, hematochezia, melena, dysuria, hematuria, chest pain, shortness of breath. Patient had a normal bowel movement this morning without blood.  ED course: VSS, afebrile Labs: WBC 19.8, creatinine 1.27, GFR 41, BUN 24, platelets 477, all the labs unremarkable Ultrasound abdomen: Marked gallbladder wall thickening with gallstones and sludge. No positive sonographic Murphy's sign. Largest gallstone measures 3.7 cm. Increased hepatic echotexture compatible with fatty infiltrative change.  ROS:  Review of Systems  Constitutional: Negative for chills, diaphoresis and fever.  HENT: Negative for congestion and sore throat.   Eyes: Negative for pain and discharge.  Respiratory: Negative for cough and shortness of breath.   Cardiovascular: Negative for chest pain and leg swelling.  Gastrointestinal: Positive for abdominal pain, nausea and vomiting. Negative for blood in stool, constipation, diarrhea and melena.  Genitourinary: Negative for dysuria, flank pain and hematuria.  Musculoskeletal: Negative for falls.  Skin: Negative for rash.  Neurological:  Negative for dizziness, loss of consciousness and headaches.  All other systems reviewed and are negative.    No family history on file.  Past Medical History:  Diagnosis Date  . Hypertension     Past Surgical History:  Procedure Laterality Date  . CESAREAN SECTION    . HIP ARTHROSCOPY Right     Social History:  reports that she quit smoking about 48 years ago. She has never used smokeless tobacco. She reports that she does not drink alcohol or use drugs.  Allergies:  Allergies  Allergen Reactions  . Altace [Ramipril]     Tongue swelling  . Vytorin [Ezetimibe-Simvastatin] Anaphylaxis  . Neosporin [Neomycin-Bacitracin Zn-Polymyx] Other (See Comments)    Eye burning   . Sulfa Antibiotics     unknown  . Triamterene     Cramps   . Zetia [Ezetimibe] Other (See Comments)    Chest pain   . Statins Rash     (Not in a hospital admission)  Blood pressure 107/56, pulse 89, temperature 97.7 F (36.5 C), temperature source Oral, resp. rate (!) 28, height _0  (1.499 m), weight 155 lb (70.3 kg), SpO2 97 %.  Physical Exam  Constitutional: She is oriented to person, place, and time and well-developed, well-nourished, and in no distress. Vital signs are normal. No distress.  Well-appearing white woman who looks younger than her stated age  HENT:  Head: Normocephalic and atraumatic.  Nose: Nose normal.  Mouth/Throat: Oropharynx is clear and moist.  Eyes: Conjunctivae are normal. Pupils are equal, round, and reactive to light. Right eye exhibits no discharge. Left eye exhibits no discharge. No scleral icterus.  Neck: Normal range of motion. Neck supple.  Cardiovascular: Normal rate, regular rhythm, normal heart sounds and intact distal pulses.  Exam reveals no gallop  and no friction rub.   No murmur heard. Pulses:      Radial pulses are 2+ on the right side, and 2+ on the left side.       Dorsalis pedis pulses are 2+ on the right side, and 2+ on the left side.   Pulmonary/Chest: Effort normal and breath sounds normal. No respiratory distress. She has no decreased breath sounds. She has no wheezes. She has no rhonchi. She has no rales.  Abdominal: Soft. Normal appearance and bowel sounds are normal. She exhibits no distension and no mass. There is tenderness in the right upper quadrant and epigastric area. There is no rigidity, no rebound, no guarding and no CVA tenderness.  Well-healed scar noted below umbilicus  Musculoskeletal: Normal range of motion. She exhibits no edema or deformity.  Neurological: She is alert and oriented to person, place, and time.  Skin: Skin is warm and dry. No rash noted. She is not diaphoretic.  Psychiatric: Mood and affect normal.  Nursing note and vitals reviewed.   Results for orders placed or performed during the hospital encounter of 11/22/16 (from the past 48 hour(s))  Lipase, blood     Status: None   Collection Time: 11/22/16  9:44 AM  Result Value Ref Range   Lipase 18 11 - 51 U/L  Comprehensive metabolic panel     Status: Abnormal   Collection Time: 11/22/16  9:44 AM  Result Value Ref Range   Sodium 134 (L) 135 - 145 mmol/L   Potassium 3.5 3.5 - 5.1 mmol/L   Chloride 99 (L) 101 - 111 mmol/L   CO2 22 22 - 32 mmol/L   Glucose, Bld 139 (H) 65 - 99 mg/dL   BUN 24 (H) 6 - 20 mg/dL   Creatinine, Ser 1.27 (H) 0.44 - 1.00 mg/dL   Calcium 8.9 8.9 - 10.3 mg/dL   Total Protein 6.6 6.5 - 8.1 g/dL   Albumin 3.2 (L) 3.5 - 5.0 g/dL   AST 18 15 - 41 U/L   ALT 16 14 - 54 U/L   Alkaline Phosphatase 46 38 - 126 U/L   Total Bilirubin 1.0 0.3 - 1.2 mg/dL   GFR calc non Af Amer 41 (L) >60 mL/min   GFR calc Af Amer 48 (L) >60 mL/min    Comment: (NOTE) The eGFR has been calculated using the CKD EPI equation. This calculation has not been validated in all clinical situations. eGFR's persistently <60 mL/min signify possible Chronic Kidney Disease.    Anion gap 13 5 - 15  CBC     Status: Abnormal   Collection Time:  11/22/16  9:44 AM  Result Value Ref Range   WBC 19.8 (H) 4.0 - 10.5 K/uL   RBC 4.57 3.87 - 5.11 MIL/uL   Hemoglobin 13.2 12.0 - 15.0 g/dL   HCT 38.5 36.0 - 46.0 %   MCV 84.2 78.0 - 100.0 fL   MCH 28.9 26.0 - 34.0 pg   MCHC 34.3 30.0 - 36.0 g/dL   RDW 13.3 11.5 - 15.5 %   Platelets 477 (H) 150 - 400 K/uL  Urinalysis, Routine w reflex microscopic     Status: Abnormal   Collection Time: 11/22/16  1:26 PM  Result Value Ref Range   Color, Urine AMBER (A) YELLOW    Comment: BIOCHEMICALS MAY BE AFFECTED BY COLOR   APPearance CLOUDY (A) CLEAR   Specific Gravity, Urine 1.020 1.005 - 1.030   pH 5.0 5.0 - 8.0   Glucose, UA 50 (  A) NEGATIVE mg/dL   Hgb urine dipstick SMALL (A) NEGATIVE   Bilirubin Urine SMALL (A) NEGATIVE   Ketones, ur NEGATIVE NEGATIVE mg/dL   Protein, ur 100 (A) NEGATIVE mg/dL   Nitrite NEGATIVE NEGATIVE   Leukocytes, UA NEGATIVE NEGATIVE   RBC / HPF 0-5 0 - 5 RBC/hpf   WBC, UA 6-30 0 - 5 WBC/hpf   Bacteria, UA FEW (A) NONE SEEN   Squamous Epithelial / LPF 6-30 (A) NONE SEEN   Mucous PRESENT    Hyaline Casts, UA PRESENT    Ca Oxalate Crys, UA PRESENT    Non Squamous Epithelial 0-5 (A) NONE SEEN   US Abdomen Limited Ruq  Result Date: 11/22/2016 CLINICAL DATA:  Two days of right upper quadrant pain EXAM: US ABDOMEN LIMITED - RIGHT UPPER QUADRANT COMPARISON:  None in PACs FINDINGS: Gallbladder: The gallbladder is adequately distended. There is echogenic sludge as well as stones present. The largest gallstone measures 3.7 cm in diameter. The gallbladder wall is thickened to 10 mm. There is no pericholecystic fluid or positive sonographic Murphy's sign. Common bile duct: Diameter: The common bile duct could not be visualized. Liver: The hepatic echotexture is increased diffusely. There is no focal mass nor ductal dilation. IMPRESSION: Marked gallbladder wall thickening with gallstones and sludge. No positive sonographic Murphy's sign. The findings are compatible with subacute or  chronic cholecystitis with stones. Increased hepatic echotexture compatible with fatty infiltrative change. No intrahepatic ductal dilation. Nonvisualization of the common bile duct. Electronically Signed   By: David  Martinique M.D.   On: 11/22/2016 12:53      Assessment/Plan  HTN-hydralazine AKI vs CKD - last creatinine recorded in EMR 1999 was 0.7 - IV fluids  Cholecystitis - Leukocytosis and ultrasound concerning for chronic versus acute cholecystitis - Will take patient to OR today for laparoscopic cholecystectomy - Nothing by mouth - IV Rocephin  Kalman Drape, [place] [facility] Surgery 11/22/2016, 2:31 PM Pager: [phone] Consults: [phone] Mon-Fri 7:00 am-4:30 pm Sat-Sun 7:00 am-11:30 am

## 2016-11-22 NOTE — Op Note (Signed)
OPERATIVE REPORT  DATE OF OPERATION: 11/22/2016  PATIENT:  Suzanne Trevino  71 y.o. female  PRE-OPERATIVE DIAGNOSIS:  Acute Cholecystitis  POST-OPERATIVE DIAGNOSIS:  Acute Gangrenous Cholecystitis  INDICATION(S) FOR OPERATION:  RUQ abdominal pain, leukocytosis, Acute cholecystitis on ultrasound  FINDINGS:  Acute gangrenous cholecystitis without perforation  PROCEDURE:  Procedure(s): LAPAROSCOPIC CHOLECYSTECTOMY  SURGEON:  Surgeon(s): Judeth Horn, MD Donnie Mesa, MD  ASSISTANT: Gershon Crane  ANESTHESIA:   general  COMPLICATIONS:  None  EBL: 50 ml  BLOOD ADMINISTERED: none  DRAINS: none   SPECIMEN:  Source of Specimen:  Gallbladder and contents  COUNTS CORRECT:  YES  PROCEDURE DETAILS: The patient was taken to the operating room and placed on the table in the supine position.  After an adequate endotracheal anesthetic was administered, the patient was prepped with ChloroPrep, and then draped in the usual manner exposing the entire abdomen laterally, inferiorly and up  to the costal margins.  After a proper timeout was performed including identifying the patient and the procedure to be performed, a Supraumbilical 99991111 midline incision was made using a #15 blade.  This was taken down to the fascia which was then incised with a #15 blade.  The edges of the fascia were tented up with Kocher clamps as the preperitoneal space was penetrated with a Kelly clamp into the peritoneum.  Once this was done, a pursestring suture of 0 Vicryl was passed around the fascial opening.  This was subsequently used to secure the [facility] cannula which was passed into the peritoneal cavity.  Once the [facility] cannula was in place, carbon dioxide gas was insufflated into the peritoneal cavity up to a maximal intra-abdominal pressure of 81mm Hg.The laparoscope, with attached camera and light source, was passed into the peritoneal cavity to visualize the direct insertion of two right upper quadrant 64mm cannulas,  and a sup-xiphoid 30mm cannula.  Once all cannulas were in place, the dissection was begun.  The gallbladder was markedly inflamed and distended and gangrenous.  It was very difficult to mobilize inferiorly and the cystic duct was shortened and inflamed.  The gallbladder had to be decompressed with an aspirating needle prior to being compressible enough to retract.   Because I did not want to tear the duct, a cholangiogram was not performed and the distal duct was clipped several times, with the transected distal end having three clips on it.  A clear window between the common bile duct, the gallbladder, the liver edge and the cystic duct was identified prior to clipping and transecting the duct..  Two ratcheted graspers were attached to the dome and infundibulum of the gallbladder and retracted towards the anterior abdominal wall and the right upper quadrant.  Using cautery attached to a dissecting forceps, the peritoneum overlaying the triangle of Chalot and the hepatoduodenal triangle was dissected away exposing the cystic duct and the cystic artery.  A critical window was developed between the CBD and the cystic duct The cystic artery was clipped proximally and distally then transected.  The gallbladder was then dissected out of the hepatic bed with significant difficulty.  A large right hepatic artery was at the base of the hepatic bed that was avoided during the dissection..  It was retrieved from the abdomen using an EcoPouch bag) without event.  Once the gallbladder was removed, the bed was inspected for hemostasis.  Once excellent hemostasis was obtained all gas and fluids were aspirated from above the liver, then the cannulas were removed.  The supraumbilical incision was  closed using the pursestring suture which was in place.  A second 0 Vicryl stich had to be placed to completely close the fascial incision which had to be enlarged to remove the 5 cm gallstone in the gallbladder.  0.50% bupivicaine  with epinephrine was injected at all sites.  All 71mm or greater cannula sites were close using a running subcuticular stitch of 4-0 Monocryl.  5.27mm cannula sites were closed with Dermabond only.Steri-Strips and Tagaderm were used to complete the dressings at all sites.  At this point all needle, sponge, and instrument counts were correct.The patient was awakened from anesthesia and taken to the PACU in stable condition.  Kathryne Eriksson. Dahlia Bailiff, MD, [place] [phone] [place] [facility]  PATIENT DISPOSITION:  PACU - hemodynamically stable.   Marquist Binstock 2/21/20185:25 PM

## 2016-11-22 NOTE — ED Notes (Signed)
Pt diaphoretic c/o dizziness acuity changed d/t pts symptoms

## 2016-11-22 NOTE — Anesthesia Preprocedure Evaluation (Addendum)
Anesthesia Evaluation  Patient identified by MRN, date of birth, ID band Patient awake    Reviewed: Allergy & Precautions, NPO status , Patient's Chart, lab work & pertinent test results  History of Anesthesia Complications Negative for: history of anesthetic complications  Airway Mallampati: II  TM Distance: >3 FB Neck ROM: Full    Dental no notable dental hx. (+) Dental Advisory Given   Pulmonary neg pulmonary ROS, former smoker,    Pulmonary exam normal        Cardiovascular hypertension, Normal cardiovascular exam     Neuro/Psych negative neurological ROS  negative psych ROS   GI/Hepatic negative GI ROS, Neg liver ROS,   Endo/Other  negative endocrine ROS  Renal/GU negative Renal ROS  negative genitourinary   Musculoskeletal negative musculoskeletal ROS (+)   Abdominal   Peds negative pediatric ROS (+)  Hematology negative hematology ROS (+)   Anesthesia Other Findings   Reproductive/Obstetrics negative OB ROS                            Anesthesia Physical Anesthesia Plan  ASA: III and emergent  Anesthesia Plan: General   Post-op Pain Management:    Induction: Intravenous  Airway Management Planned: Oral ETT  Additional Equipment:   Intra-op Plan:   Post-operative Plan: Extubation in OR  Informed Consent: I have reviewed the patients History and Physical, chart, labs and discussed the procedure including the risks, benefits and alternatives for the proposed anesthesia with the patient or authorized representative who has indicated his/her understanding and acceptance.   Dental advisory given  Plan Discussed with: CRNA, Anesthesiologist and Surgeon  Anesthesia Plan Comments:        Anesthesia Quick Evaluation

## 2016-11-22 NOTE — ED Notes (Signed)
Consent at bedside.  

## 2016-11-22 NOTE — Anesthesia Postprocedure Evaluation (Signed)
Anesthesia Post Note  Patient: Suzanne Trevino  Procedure(s) Performed: Procedure(s) (LRB): LAPAROSCOPIC CHOLECYSTECTOMY (N/A)  Patient location during evaluation: PACU Anesthesia Type: General Level of consciousness: awake and alert, oriented and patient cooperative Pain management: pain level controlled Vital Signs Assessment: post-procedure vital signs reviewed and stable Respiratory status: spontaneous breathing, nonlabored ventilation and respiratory function stable Cardiovascular status: blood pressure returned to baseline and stable Postop Assessment: no signs of nausea or vomiting Anesthetic complications: no       Last Vitals:  Vitals:   11/22/16 1830 11/22/16 1920  BP: (!) 122/59 (!) 125/56  Pulse: 96 91  Resp: 19   Temp: 36.7 C 36.7 C    Last Pain:  Vitals:   11/22/16 1920  TempSrc: Oral  PainSc:                  [name],E. Falon Flinchum

## 2016-11-22 NOTE — Transfer of Care (Signed)
Immediate Anesthesia Transfer of Care Note  Patient: Suzanne Trevino  Procedure(s) Performed: Procedure(s): LAPAROSCOPIC CHOLECYSTECTOMY (N/A)  Patient Location: PACU  Anesthesia Type:General  Level of Consciousness: awake, alert , oriented and patient cooperative  Airway & Oxygen Therapy: Patient Spontanous Breathing and Patient connected to nasal cannula oxygen  Post-op Assessment: Report given to RN, Post -op Vital signs reviewed and stable, Patient moving all extremities and Patient moving all extremities X 4  Post vital signs: Reviewed and stable  Last Vitals:  Vitals:   11/22/16 1430 11/22/16 1745  BP: 117/69   Pulse: 93 100  Resp:  14  Temp:      Last Pain:  Vitals:   11/22/16 1317  TempSrc:   PainSc: 4          Complications: No apparent anesthesia complications

## 2016-11-22 NOTE — ED Provider Notes (Signed)
[facility] DEPT Provider Note   CSN: [id number] Arrival date & time: 11/22/16  [id number]     History   Chief Complaint Chief Complaint  Patient presents with  . Abdominal Pain    HPI Suzanne Trevino is a 71 y.o. female.  HPI  71 year old female with past medical history of hypertension presents to the emergency Department secondary to abdominal pain and vomiting. Patient states that she had some epigastric type abdominal pain starting Monday night and then at 1:30 in the morning she woke up with vomiting and significant worsening pain. Seem to be worse in the right upper quadrant. This continued throughout the morning so she saw her primary doctor yesterday where she was given Phenergan and labs were checked. Her white blood cell count was elevated and her heart rate was slightly high but no other abnormalities. She states that her vomiting improved after that however she has persistent right upper quadrant and epigastric abdominal pain that has been unrelieved. Right now she still little bit nauseous but that seems to improve significantly. When she did vomit yesterday it was yellow in color but no blood or dark green or dark black. She's had normal bowel movements today. She had normal urination without any changes.  Past Medical History:  Diagnosis Date  . Hypertension     There are no active problems to display for this patient.   Past Surgical History:  Procedure Laterality Date  . CESAREAN SECTION    . HIP ARTHROSCOPY Right     OB History    No data available       Home Medications    Prior to Admission medications   Medication Sig Start Date End Date Taking? Authorizing Provider  amLODipine (NORVASC) 5 MG tablet Take 5 mg by mouth daily.   Yes Historical Provider, MD  Calcium Carb-Cholecalciferol (CALCIUM-VITAMIN D3) 600-400 MG-UNIT CAPS Take 1 tablet by mouth daily.   Yes Historical Provider, MD  fenofibrate 160 MG tablet Take 160 mg by mouth daily.   Yes  Historical Provider, MD  Multiple Vitamins-Minerals (MULTIVITAMIN WITH MINERALS) tablet Take 1 tablet by mouth daily.   Yes Historical Provider, MD  promethazine (PHENERGAN) 25 MG tablet Take 25 mg by mouth every 4 (four) hours as needed for nausea or vomiting.   Yes Historical Provider, MD    Family History No family history on file.  Social History Social History  Substance Use Topics  . Smoking status: Former Smoker    Quit date: 10/02/1968  . Smokeless tobacco: Never Used  . Alcohol use No     Allergies   Altace [ramipril]; Vytorin [ezetimibe-simvastatin]; Neosporin [neomycin-bacitracin zn-polymyx]; Sulfa antibiotics; Triamterene; Zetia [ezetimibe]; and Statins   Review of Systems Review of Systems  All other systems reviewed and are negative.    Physical Exam Updated Vital Signs BP 103/68 (BP Location: Right Arm)   Pulse 116   Temp 97.7 F (36.5 C) (Oral)   Resp 16   SpO2 96%   Physical Exam  Constitutional: She is oriented to person, place, and time. She appears well-developed and well-nourished.  HENT:  Head: Normocephalic and atraumatic.  Eyes: Conjunctivae and EOM are normal.  Neck: Normal range of motion.  Cardiovascular: Normal rate and regular rhythm.   Pulmonary/Chest: No stridor. No respiratory distress.  Abdominal: Soft. She exhibits no distension. There is tenderness (ruq and positive rosvings).  Musculoskeletal: Normal range of motion. She exhibits no edema or deformity.  Neurological: She is alert and oriented to person, place,  and time. No cranial nerve deficit.  Skin: Skin is warm and dry.  Nursing note and vitals reviewed.    ED Treatments / Results  Labs (all labs ordered are listed, but only abnormal results are displayed) Labs Reviewed  COMPREHENSIVE METABOLIC PANEL - Abnormal; Notable for the following:       Result Value   Sodium 134 (*)    Chloride 99 (*)    Glucose, Bld 139 (*)    BUN 24 (*)    Creatinine, Ser 1.27 (*)     Albumin 3.2 (*)    GFR calc non Af Amer 41 (*)    GFR calc Af Amer 48 (*)    All other components within normal limits  CBC - Abnormal; Notable for the following:    WBC 19.8 (*)    Platelets 477 (*)    All other components within normal limits  LIPASE, BLOOD  URINALYSIS, ROUTINE W REFLEX MICROSCOPIC    EKG  EKG Interpretation None       Radiology No results found.  Procedures Procedures (including critical care time)  Medications Ordered in ED Medications - No data to display   Initial Impression / Assessment and Plan / ED Course  I have reviewed the triage vital signs and the nursing notes.  Pertinent labs & imaging results that were available during my care of the patient were reviewed by me and considered in my medical decision making (see chart for details).     Cholecystitis vs appendicitis. Will start with [place] and if negative, CT. Fluids/pain meds/anti-emetics in mean time.    Likely cholecystitis. Surgery consulted, will admit and manage.   Final Clinical Impressions(s) / ED Diagnoses   Final diagnoses:  RUQ pain      Merrily Pew, MD 11/23/16 [phone]

## 2016-11-23 ENCOUNTER — Encounter (HOSPITAL_COMMUNITY): Payer: Self-pay | Admitting: General Surgery

## 2016-11-23 DIAGNOSIS — K810 Acute cholecystitis: Secondary | ICD-10-CM | POA: Diagnosis present

## 2016-11-23 DIAGNOSIS — I10 Essential (primary) hypertension: Secondary | ICD-10-CM | POA: Diagnosis present

## 2016-11-23 DIAGNOSIS — Z882 Allergy status to sulfonamides status: Secondary | ICD-10-CM | POA: Diagnosis not present

## 2016-11-23 DIAGNOSIS — Z888 Allergy status to other drugs, medicaments and biological substances status: Secondary | ICD-10-CM | POA: Diagnosis not present

## 2016-11-23 DIAGNOSIS — Z79899 Other long term (current) drug therapy: Secondary | ICD-10-CM | POA: Diagnosis not present

## 2016-11-23 DIAGNOSIS — R1011 Right upper quadrant pain: Secondary | ICD-10-CM | POA: Diagnosis present

## 2016-11-23 DIAGNOSIS — Z96649 Presence of unspecified artificial hip joint: Secondary | ICD-10-CM | POA: Diagnosis present

## 2016-11-23 DIAGNOSIS — Z87891 Personal history of nicotine dependence: Secondary | ICD-10-CM | POA: Diagnosis not present

## 2016-11-23 MED ORDER — ENOXAPARIN SODIUM 40 MG/0.4ML ~~LOC~~ SOLN
40.0000 mg | SUBCUTANEOUS | Status: DC
  Administered 2016-11-23: 40 mg via SUBCUTANEOUS
  Filled 2016-11-23: qty 0.4

## 2016-11-23 MED ORDER — PANTOPRAZOLE SODIUM 40 MG PO TBEC
40.0000 mg | DELAYED_RELEASE_TABLET | Freq: Every day | ORAL | Status: DC
  Administered 2016-11-23: 40 mg via ORAL
  Filled 2016-11-23: qty 1

## 2016-11-23 NOTE — Progress Notes (Signed)
Patient ID: Suzanne Trevino, female   DOB: Jul 11, 1946, 71 y.o.   MRN: [id number]  [facility] Surgery Progress Note  1 Day Post-Op  Subjective: Patient anxious about going home, she lives alone. Overall doing well. She tolerated a normal diet. She is passing flatus. Pain well controlled. On IV antibiotics.  Objective: Vital signs in last 24 hours: Temp:  [97.5 F (36.4 C)-98.5 F (36.9 C)] 98.5 F (36.9 C) (02/22 0553) Pulse Rate:  [78-111] 78 (02/22 0553) Resp:  [14-28] 18 (02/22 0553) BP: (101-135)/(53-103) 110/54 (02/22 0553) SpO2:  [92 %-99 %] 94 % (02/22 0553) Weight:  [155 lb (70.3 kg)-165 lb (74.8 kg)] 165 lb (74.8 kg) (02/21 1920)    Intake/Output from previous day: 02/21 0701 - 02/22 0700 In: 3280 [P.O.:1080; I.V.:1100; IV Piggyback:1100] Out: 410 [Urine:400; Blood:10] Intake/Output this shift: Total I/O In: 570 [P.O.:570] Out: 100 [Urine:100]  PE: Gen:  Alert, NAD, pleasant Card:  RRR, no M/G/R heard Pulm:  CTAB, no W/R/R Abd: Soft, ND, appropriately tender, +BS, no HSM, multiple lap incisions with no signs of infection  Lab Results:   Recent Labs  11/22/16 0944  WBC 19.8*  HGB 13.2  HCT 38.5  PLT 477*   BMET  Recent Labs  11/22/16 0944  NA 134*  K 3.5  CL 99*  CO2 22  GLUCOSE 139*  BUN 24*  CREATININE 1.27*  CALCIUM 8.9   PT/INR No results for input(s): LABPROT, INR in the last 72 hours. CMP     Component Value Date/Time   NA 134 (L) 11/22/2016 0944   K 3.5 11/22/2016 0944   CL 99 (L) 11/22/2016 0944   CO2 22 11/22/2016 0944   GLUCOSE 139 (H) 11/22/2016 0944   BUN 24 (H) 11/22/2016 0944   CREATININE 1.27 (H) 11/22/2016 0944   CALCIUM 8.9 11/22/2016 0944   PROT 6.6 11/22/2016 0944   ALBUMIN 3.2 (L) 11/22/2016 0944   AST 18 11/22/2016 0944   ALT 16 11/22/2016 0944   ALKPHOS 46 11/22/2016 0944   BILITOT 1.0 11/22/2016 0944   GFRNONAA 41 (L) 11/22/2016 0944   GFRAA 48 (L) 11/22/2016 0944   Lipase     Component Value  Date/Time   LIPASE 18 11/22/2016 0944       Studies/Results: US Abdomen Limited Ruq  Result Date: 11/22/2016 CLINICAL DATA:  Two days of right upper quadrant pain EXAM: US ABDOMEN LIMITED - RIGHT UPPER QUADRANT COMPARISON:  None in PACs FINDINGS: Gallbladder: The gallbladder is adequately distended. There is echogenic sludge as well as stones present. The largest gallstone measures 3.7 cm in diameter. The gallbladder wall is thickened to 10 mm. There is no pericholecystic fluid or positive sonographic Murphy's sign. Common bile duct: Diameter: The common bile duct could not be visualized. Liver: The hepatic echotexture is increased diffusely. There is no focal mass nor ductal dilation. IMPRESSION: Marked gallbladder wall thickening with gallstones and sludge. No positive sonographic Murphy's sign. The findings are compatible with subacute or chronic cholecystitis with stones. Increased hepatic echotexture compatible with fatty infiltrative change. No intrahepatic ductal dilation. Nonvisualization of the common bile duct. Electronically Signed   By: David  Martinique M.D.   On: 11/22/2016 12:53    Anti-infectives: Anti-infectives    Start     Dose/Rate Route Frequency Ordered Stop   11/23/16 0600  piperacillin-tazobactam (ZOSYN) IVPB 3.375 g     3.375 g 12.5 mL/hr over 240 Minutes Intravenous To Surgery 11/22/16 1448 11/22/16 1635   11/22/16 2200  piperacillin-tazobactam (  ZOSYN) IVPB 3.375 g     3.375 g 12.5 mL/hr over 240 Minutes Intravenous Every 8 hours 11/22/16 1846     11/22/16 1500  cefTRIAXone (ROCEPHIN) 2 g in dextrose 5 % 50 mL IVPB  Status:  Discontinued     2 g 100 mL/hr over 30 Minutes Intravenous Every 24 hours 11/22/16 1446 11/22/16 1448       Assessment/Plan Acute Gangrenous Cholecystitis S/p LAPAROSCOPIC CHOLECYSTECTOMY 2/21 Dr. Hulen Skains - POD 1 - afebrile  ID - zosyn 2/21>> FEN - soft diet, advance as tolerated VTE - SCDs, lovenox  Plan - continue IV antibiotics for  gangrenous gallbladder. Continue working on pain control. Encourage mobilization. Patient will likely be ready to discharge tomorrow with PO antibiotics.   LOS: 0 days    Jerrye Beavers , [facility] Surgery 11/23/2016, 11:18 AM Pager: [phone] Consults: [phone] Mon-Fri 7:00 am-4:30 pm Sat-Sun 7:00 am-11:30 am

## 2016-11-23 NOTE — Care Management Obs Status (Signed)
[facility] NOTIFICATION   Patient Details  Name: ERICK OCHELTREE MRN: [id number] Date of Birth: 1946/03/10   Medicare Observation Status Notification Given:  Yes    Carles Collet, RN 11/23/2016, 9:55 AM

## 2016-11-23 NOTE — Discharge Instructions (Signed)
°LAPAROSCOPIC SURGERY: POST OP INSTRUCTIONS  °1. DIET: Follow a light bland diet the first 24 hours after arrival home, such as soup, liquids, crackers, etc. Be sure to include lots of fluids daily. Avoid fast food or heavy meals as your are more likely to get nauseated. Eat a low fat the next few days after surgery.  °2. Take your usually prescribed home medications unless otherwise directed. °3. PAIN CONTROL:  °1. Pain is best controlled by a usual combination of three different methods TOGETHER:  °1. Ice/Heat °2. Over the counter pain medication °3. Prescription pain medication °2. Most patients will experience some swelling and bruising around the incisions. Ice packs or heating pads (30-60 minutes up to 6 times a day) will help. Use ice for the first few days to help decrease swelling and bruising, then switch to heat to help relax tight/sore spots and speed recovery. Some people prefer to use ice alone, heat alone, alternating between ice & heat. Experiment to what works for you. Swelling and bruising can take several weeks to resolve.  °3. It is helpful to take an over-the-counter pain medication regularly for the first few weeks. Choose one of the following that works best for you:  °1. Naproxen (Aleve, etc) Two 220mg tabs twice a day °2. Ibuprofen (Advil, etc) Three 200mg tabs four times a day (every meal & bedtime) °3. Acetaminophen (Tylenol, etc) 500-650mg four times a day (every meal & bedtime) °4. A prescription for pain medication (such as oxycodone, hydrocodone, etc) should be given to you upon discharge. Take your pain medication as prescribed.  °1. If you are having problems/concerns with the prescription medicine (does not control pain, nausea, vomiting, rash, itching, etc), please call us [phone] to see if we need to switch you to a different pain medicine that will work better for you and/or control your side effect better. °2. If you need a refill on your pain medication, please contact  your pharmacy. They will contact our office to request authorization. Prescriptions will not be filled after 5 pm or on week-ends. °4. Avoid getting constipated. Between the surgery and the pain medications, it is common to experience some constipation. Increasing fluid intake and taking a fiber supplement (such as Metamucil, Citrucel, FiberCon, MiraLax, etc) 1-2 times a day regularly will usually help prevent this problem from occurring. A mild laxative (prune juice, Milk of Magnesia, MiraLax, etc) should be taken according to package directions if there are no bowel movements after 48 hours.  °5. Watch out for diarrhea. If you have many loose bowel movements, simplify your diet to bland foods & liquids for a few days. Stop any stool softeners and decrease your fiber supplement. Switching to mild anti-diarrheal medications (Kayopectate, Pepto Bismol) can help. If this worsens or does not improve, please call us. °6. Wash / shower every day. You may shower over the dressings as they are waterproof. Continue to shower over incision(s) after the dressing is off. If there is glue over the incisions try not to pick it off, let it fall off naturally. °7. Remove your waterproof bandages 5 days after surgery. You may leave the incision open to air. You may replace a dressing/Band-Aid to cover the incision for comfort if you wish.  °8. ACTIVITIES as tolerated:  °1. You may resume regular (light) daily activities beginning the next day--such as daily self-care, walking, climbing stairs--gradually increasing activities as tolerated. If you can walk 30 minutes without difficulty, it is safe to try more intense activity   such as jogging, treadmill, bicycling, low-impact aerobics, swimming, etc. °2. Save the most intensive and strenuous activity for last such as sit-ups, heavy lifting, contact sports, etc Refrain from any heavy lifting or straining until you are off narcotics for pain control. For the first 2-3 weeks do not lift  over 10-15lb.  °3. DO NOT PUSH THROUGH PAIN. Let pain be your guide: If it hurts to do something, don't do it. Pain is your body warning you to avoid that activity for another week until the pain goes down. °4. You may drive when you are no longer taking prescription pain medication, you can comfortably wear a seatbelt, and you can safely maneuver your car and apply brakes. °5. You may have sexual intercourse when it is comfortable.  °9. FOLLOW UP in our office  °1. Please call [facility] at [phone] to set up an appointment to see your surgeon in the office for a follow-up appointment approximately 2-3 weeks after your surgery. °2. Make sure that you call for this appointment the day you arrive home to insure a convenient appointment time. °     10. IF YOU HAVE DISABILITY OR FAMILY LEAVE FORMS, BRING THEM TO THE               OFFICE FOR PROCESSING.  ° °WHEN TO CALL US [phone]:  °1. Poor pain control °2. Reactions / problems with new medications (rash/itching, nausea, etc)  °3. Fever over 101.5 F (38.5 C) °4. Inability to urinate °5. Nausea and/or vomiting °6. Worsening swelling or bruising °7. Continued bleeding from incision. °8. Increased pain, redness, or drainage from the incision ° °The clinic staff is available to answer your questions during regular business hours (8:30am-5pm). Please don’t hesitate to call and ask to speak to one of our nurses for clinical concerns.  °If you have a medical emergency, go to the nearest emergency room or call 911.  °A surgeon from [facility] is always on call at the hospitals  ° °[facility]  °[address], [address], [place], [place] [address] ?  °MAIN: [phone] ? TOLL FREE: [phone] ?  °FAX [phone]  °[email or web address] ° ° °

## 2016-11-24 MED ORDER — AMOXICILLIN-POT CLAVULANATE 875-125 MG PO TABS
1.0000 | ORAL_TABLET | Freq: Two times a day (BID) | ORAL | 0 refills | Status: DC
Start: 2016-11-24 — End: 2022-01-25

## 2016-11-24 NOTE — Progress Notes (Signed)
Discharge home. Home discharge instruction given, noq question verbalized.

## 2016-11-24 NOTE — Discharge Summary (Signed)
  [facility] Discharge Summary   Patient ID: Suzanne Trevino MRN: [id number] DOB/AGE: 1946-05-25 71 y.o.  Admit date: 11/22/2016 Discharge date: 11/24/2016  Admitting Diagnosis: RUQ pain  Discharge Diagnosis Patient Active Problem List   Diagnosis Date Noted  . Cholecystitis 11/22/2016    Consultants None  Imaging: US abdomen limited RUQ 11/22/16: Marked gallbladder wall thickening with gallstones and sludge. No positive sonographic Murphy's sign. The findings are compatible with subacute or chronic cholecystitis with stones. Increased hepatic echotexture compatible with fatty infiltrative change. No intrahepatic ductal dilation. Nonvisualization of the common bile duct.  Procedures Dr. Hulen Skains (11/22/16) - Laparoscopic Cholecystectomy  Hospital Course:  Suzanne Trevino  is a 71yo female who [facility] to [facility] 11/22/16 with RUQ pain associated with nausea and vomiting x2 days.  Workup showed acute cholecystitis with leukocytosis.  Patient was admitted and underwent procedure listed above.  Tolerated procedure well and was transferred to the floor.  Diet was advanced as tolerated.  On POD2 the patient was voiding well, tolerating diet, ambulating well, pain well controlled, vital signs stable, incisions c/d/i and felt stable for discharge home.  Patient will follow up in our office in about 3 weeks and knows to call with questions or concerns.     Physical Exam: General:  Alert, NAD, pleasant, comfortable Pulm: effort normal Abd:  Soft, ND, appropriately tender, multiple lap incisions C/D/I, +BS  Allergies as of 11/24/2016      Reactions   Altace [ramipril]    Tongue swelling   Vytorin [ezetimibe-simvastatin] Anaphylaxis   Neosporin [neomycin-bacitracin Zn-polymyx] Other (See Comments)   Eye burning   Sulfa Antibiotics    unknown   Triamterene    Cramps   Zetia [ezetimibe] Other (See Comments)   Chest pain   Statins Rash      Medication List    TAKE these  medications   amLODipine 5 MG tablet Commonly known as:  NORVASC Take 5 mg by mouth daily.   amoxicillin-clavulanate 875-125 MG tablet Commonly known as:  AUGMENTIN Take 1 tablet by mouth 2 (two) times daily.   Calcium-Vitamin D3 600-400 MG-UNIT Caps Take 1 tablet by mouth daily.   fenofibrate 160 MG tablet Take 160 mg by mouth daily.   multivitamin with minerals tablet Take 1 tablet by mouth daily.   promethazine 25 MG tablet Commonly known as:  PHENERGAN Take 25 mg by mouth every 4 (four) hours as needed for nausea or vomiting.        Follow-up [facility], [place]. Go on 12/19/2016.   Specialty:  General Surgery Why:  Your appointment is 12/19/16 at 9:30AM. Please arrive 30 minutes prior to your appointment to check in and fill out necessary paperwork. Contact information: [address] [place] [phone]          Signed: Jerrye Beavers, [facility] 11/24/2016, 4:11 PM Pager: [phone] Consults: [phone] Mon-Fri 7:00 am-4:30 pm Sat-Sun 7:00 am-11:30 am

## 2016-12-06 DIAGNOSIS — R7301 Impaired fasting glucose: Secondary | ICD-10-CM | POA: Diagnosis not present

## 2016-12-15 DIAGNOSIS — R350 Frequency of micturition: Secondary | ICD-10-CM | POA: Diagnosis not present

## 2017-01-19 DIAGNOSIS — M1712 Unilateral primary osteoarthritis, left knee: Secondary | ICD-10-CM | POA: Diagnosis not present

## 2017-01-19 DIAGNOSIS — Z471 Aftercare following joint replacement surgery: Secondary | ICD-10-CM | POA: Diagnosis not present

## 2017-01-19 DIAGNOSIS — Z96641 Presence of right artificial hip joint: Secondary | ICD-10-CM | POA: Diagnosis not present

## 2017-01-19 DIAGNOSIS — Z9889 Other specified postprocedural states: Secondary | ICD-10-CM | POA: Diagnosis not present

## 2017-04-05 DIAGNOSIS — Z0000 Encounter for general adult medical examination without abnormal findings: Secondary | ICD-10-CM | POA: Diagnosis not present

## 2017-04-05 DIAGNOSIS — R7301 Impaired fasting glucose: Secondary | ICD-10-CM | POA: Diagnosis not present

## 2017-04-05 DIAGNOSIS — Z1389 Encounter for screening for other disorder: Secondary | ICD-10-CM | POA: Diagnosis not present

## 2017-04-05 DIAGNOSIS — I10 Essential (primary) hypertension: Secondary | ICD-10-CM | POA: Diagnosis not present

## 2017-04-05 DIAGNOSIS — E785 Hyperlipidemia, unspecified: Secondary | ICD-10-CM | POA: Diagnosis not present

## 2017-09-26 DIAGNOSIS — Z1231 Encounter for screening mammogram for malignant neoplasm of breast: Secondary | ICD-10-CM | POA: Diagnosis not present

## 2017-10-25 DIAGNOSIS — M1712 Unilateral primary osteoarthritis, left knee: Secondary | ICD-10-CM | POA: Diagnosis not present

## 2018-03-15 DIAGNOSIS — M1712 Unilateral primary osteoarthritis, left knee: Secondary | ICD-10-CM | POA: Diagnosis not present

## 2018-03-15 DIAGNOSIS — Z96641 Presence of right artificial hip joint: Secondary | ICD-10-CM | POA: Diagnosis not present

## 2018-05-03 DIAGNOSIS — Z0000 Encounter for general adult medical examination without abnormal findings: Secondary | ICD-10-CM | POA: Diagnosis not present

## 2018-05-03 DIAGNOSIS — E785 Hyperlipidemia, unspecified: Secondary | ICD-10-CM | POA: Diagnosis not present

## 2018-05-03 DIAGNOSIS — Z1389 Encounter for screening for other disorder: Secondary | ICD-10-CM | POA: Diagnosis not present

## 2018-05-03 DIAGNOSIS — I10 Essential (primary) hypertension: Secondary | ICD-10-CM | POA: Diagnosis not present

## 2018-05-03 DIAGNOSIS — G720 Drug-induced myopathy: Secondary | ICD-10-CM | POA: Diagnosis not present

## 2018-07-31 DIAGNOSIS — M1712 Unilateral primary osteoarthritis, left knee: Secondary | ICD-10-CM | POA: Diagnosis not present

## 2018-12-05 DIAGNOSIS — Z1231 Encounter for screening mammogram for malignant neoplasm of breast: Secondary | ICD-10-CM | POA: Diagnosis not present

## 2018-12-11 DIAGNOSIS — M25561 Pain in right knee: Secondary | ICD-10-CM | POA: Diagnosis not present

## 2018-12-11 DIAGNOSIS — M25562 Pain in left knee: Secondary | ICD-10-CM | POA: Diagnosis not present

## 2019-04-11 DIAGNOSIS — M1712 Unilateral primary osteoarthritis, left knee: Secondary | ICD-10-CM | POA: Diagnosis not present

## 2019-04-11 DIAGNOSIS — Z471 Aftercare following joint replacement surgery: Secondary | ICD-10-CM | POA: Diagnosis not present

## 2019-04-11 DIAGNOSIS — Z96641 Presence of right artificial hip joint: Secondary | ICD-10-CM | POA: Diagnosis not present

## 2019-06-05 DIAGNOSIS — I10 Essential (primary) hypertension: Secondary | ICD-10-CM | POA: Diagnosis not present

## 2019-06-05 DIAGNOSIS — G720 Drug-induced myopathy: Secondary | ICD-10-CM | POA: Diagnosis not present

## 2019-06-05 DIAGNOSIS — I700 Atherosclerosis of aorta: Secondary | ICD-10-CM | POA: Diagnosis not present

## 2019-06-05 DIAGNOSIS — Z1389 Encounter for screening for other disorder: Secondary | ICD-10-CM | POA: Diagnosis not present

## 2019-06-05 DIAGNOSIS — E785 Hyperlipidemia, unspecified: Secondary | ICD-10-CM | POA: Diagnosis not present

## 2019-06-05 DIAGNOSIS — R7301 Impaired fasting glucose: Secondary | ICD-10-CM | POA: Diagnosis not present

## 2019-06-05 DIAGNOSIS — Z0000 Encounter for general adult medical examination without abnormal findings: Secondary | ICD-10-CM | POA: Diagnosis not present

## 2019-06-12 DIAGNOSIS — I10 Essential (primary) hypertension: Secondary | ICD-10-CM | POA: Diagnosis not present

## 2019-06-12 DIAGNOSIS — E785 Hyperlipidemia, unspecified: Secondary | ICD-10-CM | POA: Diagnosis not present

## 2019-06-12 DIAGNOSIS — R7301 Impaired fasting glucose: Secondary | ICD-10-CM | POA: Diagnosis not present

## 2019-09-05 DIAGNOSIS — M25562 Pain in left knee: Secondary | ICD-10-CM | POA: Diagnosis not present

## 2019-12-09 DIAGNOSIS — L814 Other melanin hyperpigmentation: Secondary | ICD-10-CM | POA: Diagnosis not present

## 2020-01-01 DIAGNOSIS — M1712 Unilateral primary osteoarthritis, left knee: Secondary | ICD-10-CM | POA: Diagnosis not present

## 2020-01-27 DIAGNOSIS — Z1231 Encounter for screening mammogram for malignant neoplasm of breast: Secondary | ICD-10-CM | POA: Diagnosis not present

## 2020-04-06 DIAGNOSIS — M170 Bilateral primary osteoarthritis of knee: Secondary | ICD-10-CM | POA: Diagnosis not present

## 2020-04-06 DIAGNOSIS — M1711 Unilateral primary osteoarthritis, right knee: Secondary | ICD-10-CM | POA: Diagnosis not present

## 2020-04-06 DIAGNOSIS — M1712 Unilateral primary osteoarthritis, left knee: Secondary | ICD-10-CM | POA: Diagnosis not present

## 2020-04-06 DIAGNOSIS — Z96641 Presence of right artificial hip joint: Secondary | ICD-10-CM | POA: Diagnosis not present

## 2020-04-06 DIAGNOSIS — Z471 Aftercare following joint replacement surgery: Secondary | ICD-10-CM | POA: Diagnosis not present

## 2020-04-29 DIAGNOSIS — M1712 Unilateral primary osteoarthritis, left knee: Secondary | ICD-10-CM | POA: Diagnosis not present

## 2020-05-06 DIAGNOSIS — M1712 Unilateral primary osteoarthritis, left knee: Secondary | ICD-10-CM | POA: Diagnosis not present

## 2020-05-13 DIAGNOSIS — M1712 Unilateral primary osteoarthritis, left knee: Secondary | ICD-10-CM | POA: Diagnosis not present

## 2020-06-09 DIAGNOSIS — Z0000 Encounter for general adult medical examination without abnormal findings: Secondary | ICD-10-CM | POA: Diagnosis not present

## 2020-06-09 DIAGNOSIS — Z86010 Personal history of colonic polyps: Secondary | ICD-10-CM | POA: Diagnosis not present

## 2020-06-09 DIAGNOSIS — M899 Disorder of bone, unspecified: Secondary | ICD-10-CM | POA: Diagnosis not present

## 2020-06-09 DIAGNOSIS — Z1389 Encounter for screening for other disorder: Secondary | ICD-10-CM | POA: Diagnosis not present

## 2020-06-09 DIAGNOSIS — I10 Essential (primary) hypertension: Secondary | ICD-10-CM | POA: Diagnosis not present

## 2020-06-09 DIAGNOSIS — G720 Drug-induced myopathy: Secondary | ICD-10-CM | POA: Diagnosis not present

## 2020-06-09 DIAGNOSIS — I700 Atherosclerosis of aorta: Secondary | ICD-10-CM | POA: Diagnosis not present

## 2020-06-09 DIAGNOSIS — R7301 Impaired fasting glucose: Secondary | ICD-10-CM | POA: Diagnosis not present

## 2020-06-09 DIAGNOSIS — E785 Hyperlipidemia, unspecified: Secondary | ICD-10-CM | POA: Diagnosis not present

## 2020-06-24 DIAGNOSIS — M1712 Unilateral primary osteoarthritis, left knee: Secondary | ICD-10-CM | POA: Diagnosis not present

## 2020-07-08 DIAGNOSIS — I10 Essential (primary) hypertension: Secondary | ICD-10-CM | POA: Diagnosis not present

## 2020-07-08 DIAGNOSIS — E785 Hyperlipidemia, unspecified: Secondary | ICD-10-CM | POA: Diagnosis not present

## 2020-09-08 DIAGNOSIS — H5213 Myopia, bilateral: Secondary | ICD-10-CM | POA: Diagnosis not present

## 2020-09-17 DIAGNOSIS — M1712 Unilateral primary osteoarthritis, left knee: Secondary | ICD-10-CM | POA: Diagnosis not present

## 2020-10-15 DIAGNOSIS — H2513 Age-related nuclear cataract, bilateral: Secondary | ICD-10-CM | POA: Diagnosis not present

## 2020-10-15 DIAGNOSIS — H18413 Arcus senilis, bilateral: Secondary | ICD-10-CM | POA: Diagnosis not present

## 2020-10-15 DIAGNOSIS — H25043 Posterior subcapsular polar age-related cataract, bilateral: Secondary | ICD-10-CM | POA: Diagnosis not present

## 2020-10-15 DIAGNOSIS — H25013 Cortical age-related cataract, bilateral: Secondary | ICD-10-CM | POA: Diagnosis not present

## 2020-10-15 DIAGNOSIS — H2512 Age-related nuclear cataract, left eye: Secondary | ICD-10-CM | POA: Diagnosis not present

## 2020-11-29 DIAGNOSIS — H2512 Age-related nuclear cataract, left eye: Secondary | ICD-10-CM | POA: Diagnosis not present

## 2020-11-29 DIAGNOSIS — H25812 Combined forms of age-related cataract, left eye: Secondary | ICD-10-CM | POA: Diagnosis not present

## 2020-11-30 DIAGNOSIS — H2511 Age-related nuclear cataract, right eye: Secondary | ICD-10-CM | POA: Diagnosis not present

## 2020-12-20 DIAGNOSIS — H25811 Combined forms of age-related cataract, right eye: Secondary | ICD-10-CM | POA: Diagnosis not present

## 2020-12-20 DIAGNOSIS — H2511 Age-related nuclear cataract, right eye: Secondary | ICD-10-CM | POA: Diagnosis not present

## 2020-12-31 DIAGNOSIS — M1712 Unilateral primary osteoarthritis, left knee: Secondary | ICD-10-CM | POA: Diagnosis not present

## 2021-02-01 DIAGNOSIS — Z1231 Encounter for screening mammogram for malignant neoplasm of breast: Secondary | ICD-10-CM | POA: Diagnosis not present

## 2021-03-24 DIAGNOSIS — Z96641 Presence of right artificial hip joint: Secondary | ICD-10-CM | POA: Diagnosis not present

## 2021-03-24 DIAGNOSIS — M1712 Unilateral primary osteoarthritis, left knee: Secondary | ICD-10-CM | POA: Diagnosis not present

## 2021-04-08 DIAGNOSIS — I700 Atherosclerosis of aorta: Secondary | ICD-10-CM | POA: Diagnosis not present

## 2021-04-08 DIAGNOSIS — M179 Osteoarthritis of knee, unspecified: Secondary | ICD-10-CM | POA: Diagnosis not present

## 2021-04-08 DIAGNOSIS — E049 Nontoxic goiter, unspecified: Secondary | ICD-10-CM | POA: Diagnosis not present

## 2021-04-08 DIAGNOSIS — I10 Essential (primary) hypertension: Secondary | ICD-10-CM | POA: Diagnosis not present

## 2021-04-08 DIAGNOSIS — E785 Hyperlipidemia, unspecified: Secondary | ICD-10-CM | POA: Diagnosis not present

## 2021-04-08 DIAGNOSIS — R7301 Impaired fasting glucose: Secondary | ICD-10-CM | POA: Diagnosis not present

## 2021-04-08 DIAGNOSIS — Z01810 Encounter for preprocedural cardiovascular examination: Secondary | ICD-10-CM | POA: Diagnosis not present

## 2021-04-08 DIAGNOSIS — Z01818 Encounter for other preprocedural examination: Secondary | ICD-10-CM | POA: Diagnosis not present

## 2021-04-08 DIAGNOSIS — E0590 Thyrotoxicosis, unspecified without thyrotoxic crisis or storm: Secondary | ICD-10-CM | POA: Diagnosis not present

## 2021-04-18 DIAGNOSIS — M1712 Unilateral primary osteoarthritis, left knee: Secondary | ICD-10-CM | POA: Diagnosis not present

## 2021-04-18 DIAGNOSIS — M25662 Stiffness of left knee, not elsewhere classified: Secondary | ICD-10-CM | POA: Diagnosis not present

## 2021-04-18 DIAGNOSIS — M25562 Pain in left knee: Secondary | ICD-10-CM | POA: Diagnosis not present

## 2021-05-17 DIAGNOSIS — G8918 Other acute postprocedural pain: Secondary | ICD-10-CM | POA: Diagnosis not present

## 2021-05-17 DIAGNOSIS — M1712 Unilateral primary osteoarthritis, left knee: Secondary | ICD-10-CM | POA: Diagnosis not present

## 2021-05-18 DIAGNOSIS — Z96652 Presence of left artificial knee joint: Secondary | ICD-10-CM | POA: Diagnosis not present

## 2021-05-18 DIAGNOSIS — M1712 Unilateral primary osteoarthritis, left knee: Secondary | ICD-10-CM | POA: Diagnosis not present

## 2021-05-19 DIAGNOSIS — M25662 Stiffness of left knee, not elsewhere classified: Secondary | ICD-10-CM | POA: Diagnosis not present

## 2021-05-23 DIAGNOSIS — M25662 Stiffness of left knee, not elsewhere classified: Secondary | ICD-10-CM | POA: Diagnosis not present

## 2021-05-25 DIAGNOSIS — M25662 Stiffness of left knee, not elsewhere classified: Secondary | ICD-10-CM | POA: Diagnosis not present

## 2021-05-27 DIAGNOSIS — M25662 Stiffness of left knee, not elsewhere classified: Secondary | ICD-10-CM | POA: Diagnosis not present

## 2021-05-30 DIAGNOSIS — M25662 Stiffness of left knee, not elsewhere classified: Secondary | ICD-10-CM | POA: Diagnosis not present

## 2021-06-01 DIAGNOSIS — M25662 Stiffness of left knee, not elsewhere classified: Secondary | ICD-10-CM | POA: Diagnosis not present

## 2021-06-03 DIAGNOSIS — M25662 Stiffness of left knee, not elsewhere classified: Secondary | ICD-10-CM | POA: Diagnosis not present

## 2021-06-09 DIAGNOSIS — M25662 Stiffness of left knee, not elsewhere classified: Secondary | ICD-10-CM | POA: Diagnosis not present

## 2021-06-13 DIAGNOSIS — M25662 Stiffness of left knee, not elsewhere classified: Secondary | ICD-10-CM | POA: Diagnosis not present

## 2021-06-14 DIAGNOSIS — G720 Drug-induced myopathy: Secondary | ICD-10-CM | POA: Diagnosis not present

## 2021-06-14 DIAGNOSIS — Z23 Encounter for immunization: Secondary | ICD-10-CM | POA: Diagnosis not present

## 2021-06-14 DIAGNOSIS — R7301 Impaired fasting glucose: Secondary | ICD-10-CM | POA: Diagnosis not present

## 2021-06-14 DIAGNOSIS — I700 Atherosclerosis of aorta: Secondary | ICD-10-CM | POA: Diagnosis not present

## 2021-06-14 DIAGNOSIS — Z0000 Encounter for general adult medical examination without abnormal findings: Secondary | ICD-10-CM | POA: Diagnosis not present

## 2021-06-14 DIAGNOSIS — E785 Hyperlipidemia, unspecified: Secondary | ICD-10-CM | POA: Diagnosis not present

## 2021-06-14 DIAGNOSIS — R946 Abnormal results of thyroid function studies: Secondary | ICD-10-CM | POA: Diagnosis not present

## 2021-06-14 DIAGNOSIS — I10 Essential (primary) hypertension: Secondary | ICD-10-CM | POA: Diagnosis not present

## 2021-06-14 DIAGNOSIS — Z1389 Encounter for screening for other disorder: Secondary | ICD-10-CM | POA: Diagnosis not present

## 2021-06-17 DIAGNOSIS — M25662 Stiffness of left knee, not elsewhere classified: Secondary | ICD-10-CM | POA: Diagnosis not present

## 2021-06-21 ENCOUNTER — Other Ambulatory Visit: Payer: Self-pay | Admitting: Family Medicine

## 2021-06-21 ENCOUNTER — Other Ambulatory Visit (HOSPITAL_COMMUNITY): Payer: Self-pay | Admitting: Family Medicine

## 2021-06-21 DIAGNOSIS — Z471 Aftercare following joint replacement surgery: Secondary | ICD-10-CM | POA: Diagnosis not present

## 2021-06-21 DIAGNOSIS — Z96652 Presence of left artificial knee joint: Secondary | ICD-10-CM | POA: Diagnosis not present

## 2021-06-21 DIAGNOSIS — E0590 Thyrotoxicosis, unspecified without thyrotoxic crisis or storm: Secondary | ICD-10-CM

## 2021-08-18 ENCOUNTER — Other Ambulatory Visit: Payer: Self-pay | Admitting: Family Medicine

## 2021-08-18 ENCOUNTER — Other Ambulatory Visit (HOSPITAL_COMMUNITY): Payer: Self-pay | Admitting: Family Medicine

## 2021-08-18 DIAGNOSIS — E0590 Thyrotoxicosis, unspecified without thyrotoxic crisis or storm: Secondary | ICD-10-CM

## 2021-09-05 ENCOUNTER — Encounter (HOSPITAL_COMMUNITY)
Admission: RE | Admit: 2021-09-05 | Discharge: 2021-09-05 | Disposition: A | Discharge status: Home / Self Care | Payer: Medicare HMO | Source: Ambulatory Visit | Attending: Family Medicine | Admitting: Family Medicine

## 2021-09-05 ENCOUNTER — Other Ambulatory Visit: Payer: Self-pay

## 2021-09-05 ENCOUNTER — Ambulatory Visit (HOSPITAL_COMMUNITY)
Admission: RE | Admit: 2021-09-05 | Discharge: 2021-09-05 | Disposition: A | Discharge status: Home / Self Care | Payer: Medicare HMO | Source: Ambulatory Visit | Attending: Family Medicine | Admitting: Family Medicine

## 2021-09-05 DIAGNOSIS — E0590 Thyrotoxicosis, unspecified without thyrotoxic crisis or storm: Secondary | ICD-10-CM | POA: Diagnosis not present

## 2021-09-05 MED ORDER — SODIUM IODIDE I-123 7.4 MBQ CAPS
428.0000 | ORAL_CAPSULE | Freq: Once | ORAL | Status: AC
  Administered 2021-09-05: 428 via ORAL

## 2021-09-06 ENCOUNTER — Encounter (HOSPITAL_COMMUNITY)
Admission: RE | Admit: 2021-09-06 | Discharge: 2021-09-06 | Disposition: A | Discharge status: Home / Self Care | Payer: Medicare HMO | Source: Ambulatory Visit | Attending: Family Medicine | Admitting: Family Medicine

## 2021-09-06 DIAGNOSIS — E0590 Thyrotoxicosis, unspecified without thyrotoxic crisis or storm: Secondary | ICD-10-CM | POA: Diagnosis not present

## 2021-09-06 DIAGNOSIS — R634 Abnormal weight loss: Secondary | ICD-10-CM | POA: Diagnosis not present

## 2021-09-06 DIAGNOSIS — E041 Nontoxic single thyroid nodule: Secondary | ICD-10-CM | POA: Diagnosis not present

## 2021-10-04 ENCOUNTER — Encounter (HOSPITAL_COMMUNITY): Payer: Self-pay | Admitting: Radiology

## 2021-10-14 ENCOUNTER — Other Ambulatory Visit: Payer: Self-pay | Admitting: Family Medicine

## 2021-10-14 DIAGNOSIS — E041 Nontoxic single thyroid nodule: Secondary | ICD-10-CM

## 2021-10-25 ENCOUNTER — Other Ambulatory Visit: Payer: Self-pay

## 2021-10-25 ENCOUNTER — Ambulatory Visit
Admission: RE | Admit: 2021-10-25 | Discharge: 2021-10-25 | Disposition: A | Discharge status: Home / Self Care | Payer: Medicare HMO | Source: Ambulatory Visit | Attending: Family Medicine | Admitting: Family Medicine

## 2021-10-25 DIAGNOSIS — E041 Nontoxic single thyroid nodule: Secondary | ICD-10-CM

## 2021-11-01 ENCOUNTER — Telehealth: Payer: Self-pay | Admitting: Endocrinology

## 2021-11-01 NOTE — Telephone Encounter (Signed)
Beverlee Nims w/Dr. Gaynelle Arabian [facility]) is calling to check the status and was in formed that it was in review.  Unable to see it in the pts chart stated it was in [place].

## 2021-11-04 ENCOUNTER — Other Ambulatory Visit: Payer: Self-pay | Admitting: Family Medicine

## 2021-11-04 DIAGNOSIS — E041 Nontoxic single thyroid nodule: Secondary | ICD-10-CM

## 2021-11-17 ENCOUNTER — Ambulatory Visit
Admission: RE | Admit: 2021-11-17 | Discharge: 2021-11-17 | Disposition: A | Discharge status: Home / Self Care | Payer: Medicare HMO | Source: Ambulatory Visit | Attending: Family Medicine | Admitting: Family Medicine

## 2021-11-17 ENCOUNTER — Other Ambulatory Visit (HOSPITAL_COMMUNITY): Payer: Self-pay | Admitting: Physician Assistant

## 2021-11-17 ENCOUNTER — Other Ambulatory Visit: Payer: Self-pay | Admitting: Family Medicine

## 2021-11-17 DIAGNOSIS — E041 Nontoxic single thyroid nodule: Secondary | ICD-10-CM

## 2021-11-17 DIAGNOSIS — J00 Acute nasopharyngitis [common cold]: Secondary | ICD-10-CM | POA: Diagnosis not present

## 2021-11-17 DIAGNOSIS — E042 Nontoxic multinodular goiter: Secondary | ICD-10-CM | POA: Diagnosis not present

## 2021-12-05 ENCOUNTER — Ambulatory Visit: Payer: Medicare HMO | Admitting: Internal Medicine

## 2022-01-05 ENCOUNTER — Ambulatory Visit: Payer: Medicare HMO | Admitting: Internal Medicine

## 2022-01-25 ENCOUNTER — Encounter: Payer: Self-pay | Admitting: Internal Medicine

## 2022-01-25 ENCOUNTER — Ambulatory Visit: Payer: Medicare HMO | Admitting: Internal Medicine

## 2022-01-25 VITALS — BP 140/86 | HR 109 | Ht 59.5 in | Wt 163.6 lb

## 2022-01-25 DIAGNOSIS — E0590 Thyrotoxicosis, unspecified without thyrotoxic crisis or storm: Secondary | ICD-10-CM

## 2022-01-25 DIAGNOSIS — E041 Nontoxic single thyroid nodule: Secondary | ICD-10-CM | POA: Diagnosis not present

## 2022-01-25 LAB — CBC WITH DIFFERENTIAL/PLATELET
Basophils Absolute: 0 10*3/uL (ref 0.0–0.1)
Basophils Relative: 0.7 % (ref 0.0–3.0)
Eosinophils Absolute: 0.1 10*3/uL (ref 0.0–0.7)
Eosinophils Relative: 1.5 % (ref 0.0–5.0)
HCT: 43 % (ref 36.0–46.0)
Hemoglobin: 14.5 g/dL (ref 12.0–15.0)
Lymphocytes Relative: 27.1 % (ref 12.0–46.0)
Lymphs Abs: 1.3 10*3/uL (ref 0.7–4.0)
MCHC: 33.7 g/dL (ref 30.0–36.0)
MCV: 85.8 fl (ref 78.0–100.0)
Monocytes Absolute: 0.4 10*3/uL (ref 0.1–1.0)
Monocytes Relative: 7.9 % (ref 3.0–12.0)
Neutro Abs: 3 10*3/uL (ref 1.4–7.7)
Neutrophils Relative %: 62.8 % (ref 43.0–77.0)
Platelets: 418 10*3/uL — ABNORMAL HIGH (ref 150.0–400.0)
RBC: 5.02 Mil/uL (ref 3.87–5.11)
RDW: 13.5 % (ref 11.5–15.5)
WBC: 4.8 10*3/uL (ref 4.0–10.5)

## 2022-01-25 LAB — COMPREHENSIVE METABOLIC PANEL
ALT: 16 U/L (ref 0–35)
AST: 14 U/L (ref 0–37)
Albumin: 4.6 g/dL (ref 3.5–5.2)
Alkaline Phosphatase: 58 U/L (ref 39–117)
BUN: 12 mg/dL (ref 6–23)
CO2: 27 mEq/L (ref 19–32)
Calcium: 9.9 mg/dL (ref 8.4–10.5)
Chloride: 102 mEq/L (ref 96–112)
Creatinine, Ser: 0.54 mg/dL (ref 0.40–1.20)
GFR: 89.5 mL/min (ref >60.00)
Glucose, Bld: 107 mg/dL — ABNORMAL HIGH (ref 70–99)
Potassium: 4.1 mEq/L (ref 3.5–5.1)
Sodium: 140 mEq/L (ref 135–145)
Total Bilirubin: 0.5 mg/dL (ref 0.2–1.2)
Total Protein: 7.1 g/dL (ref 6.0–8.3)

## 2022-01-25 LAB — TSH: TSH: 0.19 u[IU]/mL — ABNORMAL LOW (ref 0.35–5.50)

## 2022-01-25 LAB — T4, FREE: Free T4: 0.98 ng/dL (ref 0.60–1.60)

## 2022-01-25 NOTE — Patient Instructions (Signed)
We recommend that you follow these hyperthyroidism instructions at home: ? ?1) Take Methimazole  ?If you develop severe sore throat with high fevers OR develop unexplained yellowing of your skin, eyes, under your tongue, severe abdominal pain with nausea or vomiting --> then please get evaluated immediately. ? ? ?2) Get repeat thyroid labs 8 weeks .  ?It is ESSENTIAL to get follow-up labs to help avoid over or undertreatment of your hyperthyroidism - both of which can be dangerous  ? ? ? ?

## 2022-01-25 NOTE — Progress Notes (Signed)
? ? ?Name: Suzanne Trevino  ?MRN/ DOB: [id number], 09-28-1946    ?Age/ Sex: 76 y.o., female   ? ?PCP: Gaynelle Arabian, MD   ?Reason for Endocrinology Evaluation: Hyperthyroidism  ?   ?Date of Initial Endocrinology Evaluation: 01/25/2022   ? ? ?HPI: ?[name] is a 76 y.o. female with a past medical history of HTN. The patient presented for initial endocrinology clinic visit on 01/25/2022 for consultative assistance with her Hyperthyroidism.  ? ?During evaluation for weight loss in September 2022 she was noted with hyperthyroidism with a suppressed TSH at 0.08 uIU/mL and elevated free T4 at 1.39 (0.61-1.12) and undetectable anti-TPO antibodies. ? ? ? ?An uptake and scan was performed on 09/06/2021 revealing normal uptake at 24% of I-123 with a left lobe cold nodule. ?Thyroid ultrasound showed a complex cystic area on the left lobe which was biopsied in 2017 with benign cytology. ? ? ? ?Today she denies weight , has gained weight recently  ?Denies palpitations ?Denies local neck swelling  ?Denies tremors or diarrhea  ?No Biotin intake  ?No prior exposure to radiation  ?No prior dx of osteoporosis nor cardiac arrythmia pp ? ?She is S/P knee replacement in 05/2021  ? ? ?Sister with hypothyroidism  ? ?HISTORY:  ?Past Medical History:  ?Past Medical History:  ?Diagnosis Date  ? Hypertension   ? ?Past Surgical History:  ?Past Surgical History:  ?Procedure Laterality Date  ? CESAREAN SECTION    ? CHOLECYSTECTOMY N/A 11/22/2016  ? Procedure: LAPAROSCOPIC CHOLECYSTECTOMY;  Surgeon: Judeth Horn, MD;  Location: [facility];  Service: General;  Laterality: N/A;  ? HIP ARTHROSCOPY Right   ?  ?Social History:  reports that she quit smoking about 53 years ago. She has never used smokeless tobacco. She reports that she does not drink alcohol and does not use drugs. ?Family History: family history is not on file. ? ? ?HOME MEDICATIONS: ?Allergies as of 01/25/2022   ? ?   Reactions  ? Altace [ramipril]   ? Tongue swelling  ? Vytorin  [ezetimibe-simvastatin] Anaphylaxis  ? Neosporin [neomycin-bacitracin Zn-polymyx] Other (See Comments)  ? Eye burning  ? Sulfa Antibiotics   ? unknown  ? Triamterene   ? Cramps  ? Zetia [ezetimibe] Other (See Comments)  ? Chest pain  ? Statins Rash  ? ?  ? ?  ?Medication List  ?  ? ?  ? Accurate as of January 25, 2022  7:02 AM. If you have any questions, ask your nurse or doctor.  ?  ?  ? ?  ? ?amLODipine 5 MG tablet ?Commonly known as: NORVASC ?Take 5 mg by mouth daily. ?  ?amoxicillin-clavulanate 875-125 MG tablet ?Commonly known as: Augmentin ?Take 1 tablet by mouth 2 (two) times daily. ?  ?Calcium-Vitamin D3 600-400 MG-UNIT Caps ?Take 1 tablet by mouth daily. ?  ?fenofibrate 160 MG tablet ?Take 160 mg by mouth daily. ?  ?multivitamin with minerals tablet ?Take 1 tablet by mouth daily. ?  ?promethazine 25 MG tablet ?Commonly known as: PHENERGAN ?Take 25 mg by mouth every 4 (four) hours as needed for nausea or vomiting. ?  ? ?  ?  ? ? ?REVIEW OF SYSTEMS: ?A comprehensive ROS was conducted with the patient and is negative except as per HPI  ? ? ?OBJECTIVE:  ?VS: BP 140/86   Pulse (!) 109   Ht 4' 11.5" (1.511 m)   Wt 163 lb 9.6 oz (74.2 kg)   SpO2 97%   BMI 32.49 kg/m?  ? ? ?  Wt Readings from Last 3 Encounters:  ?11/22/16 165 lb (74.8 kg)  ? ? ? ?EXAM: ?General: Pt appears well and is in NAD  ?Hydration: Well-hydrated with moist mucous membranes and good skin turgor  ?Eyes: External eye exam normal without stare, lid lag or exophthalmos.  EOM intact.  PERRL.  ?Ears, Nose, Throat: Hearing: Grossly intact bilaterally ?Dental: Good dentition  ?Throat: Clear without mass, erythema or exudate  ?Neck: General: Supple without adenopathy. ?Thyroid: Thyroid size normal.  No goiter or nodules appreciated. No thyroid bruit.  ?Lungs: Clear with good BS bilat with no rales, rhonchi, or wheezes  ?Heart: Auscultation: RRR.  ?Abdomen: Normoactive bowel sounds, soft, nontender, without masses or organomegaly palpable   ?Extremities:  ?BL LE: No pretibial edema normal ROM and strength.  ?Mental Status: Judgment, insight: Intact ?Orientation: Oriented to time, place, and person ?Mood and affect: No depression, anxiety, or agitation  ? ? ? ?DATA REVIEWED: ? ?  ? Latest Reference Range & Units 01/25/22 12:04  ?Sodium 135 - 145 mEq/L 140  ?Potassium 3.5 - 5.1 mEq/L 4.1  ?Chloride 96 - 112 mEq/L 102  ?CO2 19 - 32 mEq/L 27  ?Glucose 70 - 99 mg/dL 107 (H)  ?BUN 6 - 23 mg/dL 12  ?Creatinine 0.40 - 1.20 mg/dL 0.54  ?Calcium 8.4 - 10.5 mg/dL 9.9  ?Alkaline Phosphatase 39 - 117 U/L 58  ?Albumin 3.5 - 5.2 g/dL 4.6  ?AST 0 - 37 U/L 14  ?ALT 0 - 35 U/L 16  ?Total Protein 6.0 - 8.3 g/dL 7.1  ?Total Bilirubin 0.2 - 1.2 mg/dL 0.5  ?GFR >60.00 mL/min 89.50  ?WBC 4.0 - 10.5 K/uL 4.8  ?RBC 3.87 - 5.11 Mil/uL 5.02  ?Hemoglobin 12.0 - 15.0 g/dL 14.5  ?HCT 36.0 - 46.0 % 43.0  ?MCV 78.0 - 100.0 fl 85.8  ?MCHC 30.0 - 36.0 g/dL 33.7  ?RDW 11.5 - 15.5 % 13.5  ?Platelets 150.0 - 400.0 K/uL 418.0 (H)  ?Neutrophils 43.0 - 77.0 % 62.8  ?Lymphocytes 12.0 - 46.0 % 27.1  ?Monocytes Relative 3.0 - 12.0 % 7.9  ?Eosinophil 0.0 - 5.0 % 1.5  ?Basophil 0.0 - 3.0 % 0.7  ?NEUT# 1.4 - 7.7 K/uL 3.0  ?Lymphocyte # 0.7 - 4.0 K/uL 1.3  ?Monocyte # 0.1 - 1.0 K/uL 0.4  ?Eosinophils Absolute 0.0 - 0.7 K/uL 0.1  ?Basophils Absolute 0.0 - 0.1 K/uL 0.0  ?TSH 0.35 - 5.50 uIU/mL 0.19 (L)  ?Triiodothyronine (T3) 76 - 181 ng/dL 148  ?T4,Free(Direct) 0.60 - 1.60 ng/dL 0.98  ? ? ? ?Thyroid uptake and scan 09/06/2021 ? ?A cold nodule is identified within the left thyroid lobe within the ?mid to upper pole. Otherwise uniform distribution of radiotracer. ?  ?4 hour I-123 uptake = 12% (normal 5-20%) ?  ?24 hour I-123 uptake = 24% (normal 10-30%) ?  ?IMPRESSION: ?Normal 4 and 24 hour iodine uptake. ?  ?Cold nodule within the left thyroid lobe. Correlation with dedicated ?thyroid sonography is recommended. ? ?Thyroid ultrasound 11/17/2021 ? ?I personally examined the patient with ultrasound.  Multiple pseudo ?nodules throughout the right thyroid lobe without a discrete right ?thyroid nodule. ?  ?Left thyroid lobe is markedly enlarged. There are complex cystic ?areas within the midportion of the left thyroid lobe with likely ?accounts for the cold nodule on the thyroid scan. This cystic area ?was present in 2017 but there is increased fluid compared to 2017. ?There is not a discrete cystic nodule in this area but this area is ?most compatible  with a TR 2 nodule since there is mixed cystic and ?solid composition with isoechoic echogenicity. The left mid thyroid ?lobe cystic area does not meet criteria for biopsy or dedicated ?follow-up. Again noted is markedly heterogeneous tissue in the left ?inferior thyroid lobe that corresponds with the previously biopsied ?area. ?  ?IMPRESSION: ?1. Thyroid is enlarged and diffusely heterogeneous without discrete ?suspicious nodules. The cold area on the previous thyroid scan ?likely corresponds with the complex cystic area within the left mid ?thyroid lobe. No areas meet criteria for ultrasound-guided biopsy at ?this time. ? ? ? ?FNA left lobe 12/21/2015 ? ?THYROID, LEFT LOBE, FINE NEEDLE ASPIRATION (SPECIMEN 1 OF 1, COLLECTED ON 12/21/2015): ?CONSISTENT WITH BENIGN FOLLICULAR NODULE (BETHESDA CATEGORY II). ?ROBERT HILLARD ?ASSESSMENT/PLAN/RECOMMENDATIONS:  ? ?Hyperthyroidism  ? ? ?-Patient is clinically euthyroid at this time ?-No local neck symptoms ?-We discussed differential diagnosis to include toxic thyroid nodule, versus Graves' disease ?-Thyroid uptake and scan showed increased uptake at the upper lobes bilaterally ?-We discussed risk of increased bone resorption as well as cardiac arrhythmia with hyperthyroidism, I would recommend proceeding with thionamides therapy ? ?-We discussed with pt the benefits of methimazole in the Tx of hyperthyroidism, as well as the possible side effects/complications of anti-thyroid drug Tx (specifically detailing the rare, but  serious side effect of agranulocytosis). She was informed of need for regular thyroid function monitoring while on methimazole to ensure appropriate dosage without over-treatment. As well, we discussed the possible side effects o

## 2022-01-26 ENCOUNTER — Encounter: Payer: Self-pay | Admitting: Internal Medicine

## 2022-01-26 MED ORDER — METHIMAZOLE 5 MG PO TABS: 5.0000 mg | ORAL_TABLET | Freq: Every day | ORAL | 1 refills | Status: DC

## 2022-01-29 LAB — TRAB (TSH RECEPTOR BINDING ANTIBODY): TRAB: <1 IU/L (ref <=2.00)

## 2022-01-29 LAB — T3: T3, Total: 148 ng/dL (ref 76–181)

## 2022-02-07 DIAGNOSIS — Z1231 Encounter for screening mammogram for malignant neoplasm of breast: Secondary | ICD-10-CM | POA: Diagnosis not present

## 2022-03-02 DIAGNOSIS — D120 Benign neoplasm of cecum: Secondary | ICD-10-CM | POA: Diagnosis not present

## 2022-03-02 DIAGNOSIS — K5730 Diverticulosis of large intestine without perforation or abscess without bleeding: Secondary | ICD-10-CM | POA: Diagnosis not present

## 2022-03-02 DIAGNOSIS — Z09 Encounter for follow-up examination after completed treatment for conditions other than malignant neoplasm: Secondary | ICD-10-CM | POA: Diagnosis not present

## 2022-03-02 DIAGNOSIS — Z86010 Personal history of colonic polyps: Secondary | ICD-10-CM | POA: Diagnosis not present

## 2022-03-02 DIAGNOSIS — K648 Other hemorrhoids: Secondary | ICD-10-CM | POA: Diagnosis not present

## 2022-03-02 DIAGNOSIS — D122 Benign neoplasm of ascending colon: Secondary | ICD-10-CM | POA: Diagnosis not present

## 2022-03-08 DIAGNOSIS — D122 Benign neoplasm of ascending colon: Secondary | ICD-10-CM | POA: Diagnosis not present

## 2022-03-23 ENCOUNTER — Other Ambulatory Visit (INDEPENDENT_AMBULATORY_CARE_PROVIDER_SITE_OTHER): Payer: Medicare HMO

## 2022-03-23 DIAGNOSIS — E0590 Thyrotoxicosis, unspecified without thyrotoxic crisis or storm: Secondary | ICD-10-CM | POA: Diagnosis not present

## 2022-03-23 LAB — T4, FREE: Free T4: 0.87 ng/dL (ref 0.60–1.60)

## 2022-03-23 LAB — TSH: TSH: 1.41 u[IU]/mL (ref 0.35–5.50)

## 2022-05-11 DIAGNOSIS — M25551 Pain in right hip: Secondary | ICD-10-CM | POA: Diagnosis not present

## 2022-05-11 DIAGNOSIS — Z96652 Presence of left artificial knee joint: Secondary | ICD-10-CM | POA: Diagnosis not present

## 2022-05-11 DIAGNOSIS — Z96641 Presence of right artificial hip joint: Secondary | ICD-10-CM | POA: Diagnosis not present

## 2022-05-30 ENCOUNTER — Encounter: Payer: Self-pay | Admitting: Internal Medicine

## 2022-05-30 ENCOUNTER — Ambulatory Visit: Payer: Medicare HMO | Admitting: Internal Medicine

## 2022-05-30 VITALS — BP 162/84 | HR 95 | Ht 59.5 in | Wt 168.8 lb

## 2022-05-30 DIAGNOSIS — E0590 Thyrotoxicosis, unspecified without thyrotoxic crisis or storm: Secondary | ICD-10-CM | POA: Diagnosis not present

## 2022-05-30 DIAGNOSIS — E041 Nontoxic single thyroid nodule: Secondary | ICD-10-CM

## 2022-05-30 LAB — TSH: TSH: 2.37 u[IU]/mL (ref 0.35–5.50)

## 2022-05-30 LAB — T4, FREE: Free T4: 0.77 ng/dL (ref 0.60–1.60)

## 2022-05-30 MED ORDER — METHIMAZOLE 5 MG PO TABS: 5.0000 mg | ORAL_TABLET | ORAL | 2 refills | Status: DC

## 2022-05-30 NOTE — Progress Notes (Signed)
Name: Suzanne Trevino  MRN/ DOB: [id number], [birth year]    Age/ Sex: 76 y.o., female    PCP: Gaynelle Arabian, MD   Reason for Endocrinology Evaluation: Hyperthyroidism     Date of Initial Endocrinology Evaluation: 01/25/2022    HPI: Suzanne Trevino is a 76 y.o. female with a past medical history of HTN. The patient presented for initial endocrinology clinic visit on 01/25/2022 for consultative assistance with her Hyperthyroidism.   During evaluation for weight loss in September 2022 she was noted with hyperthyroidism with a suppressed TSH at 0.08 uIU/mL and elevated free T4 at 1.39 (0.61-1.12) and undetectable anti-TPO antibodies.    An uptake and scan was performed on 09/06/2021 revealing normal uptake at 24% of I-123 with a left lobe cold nodule. Thyroid ultrasound showed a complex cystic area on the left lobe which was biopsied in 2017 with benign cytology.   On her initial visit to our clinic she had an undetectable TRAb  Repeat thyroid ultrasound on November 17, 2021 revealed heterogeneous thyroid gland without discrete nodules.  On her initial visit to our clinic her TSH was low at 0.19 u U IU/mL, normal T3 and free T4.  She was started on methimazole at the time  No prior exposure to radiation  No prior dx of osteoporosis nor cardiac arrythmia pp  Sister with hypothyroidism   SUBJECTIVE:     Today (05/30/22): Suzanne Trevino is here for follow-up on multinodular goiter and hyperthyroidism.  Weight has increased  Denies palpitations Denies local neck swelling  Denies tremors Denies  diarrhea or abdominal pain  No Biotin intake    Methimazole 5 mg daily   HISTORY:  Past Medical History:  Past Medical History:  Diagnosis Date   Hypertension    Past Surgical History:  Past Surgical History:  Procedure Laterality Date   CESAREAN SECTION     CHOLECYSTECTOMY N/A 11/22/2016   Procedure: LAPAROSCOPIC CHOLECYSTECTOMY;  Surgeon: Judeth Horn, MD;  Location: [facility];   Service: General;  Laterality: N/A;   HIP ARTHROSCOPY Right     Social History:  reports that she quit smoking about 53 years ago. Her smoking use included cigarettes. She has never used smokeless tobacco. She reports that she does not drink alcohol and does not use drugs. Family History: family history is not on file.   HOME MEDICATIONS: Allergies as of 05/30/2022       Reactions   Altace [ramipril]    Tongue swelling   Vytorin [ezetimibe-simvastatin] Anaphylaxis   Neosporin [neomycin-bacitracin Zn-polymyx] Other (See Comments)   Eye burning   Sulfa Antibiotics    unknown   Triamterene    Cramps   Zetia [ezetimibe] Other (See Comments)   Chest pain   Statins Rash        Medication List        Accurate as of May 30, 2022 12:36 PM. If you have any questions, ask your nurse or doctor.          amLODipine 5 MG tablet Commonly known as: NORVASC Take 5 mg by mouth daily.   Calcium-Vitamin D3 600-400 MG-UNIT Caps Take 1 tablet by mouth daily.   fenofibrate 160 MG tablet Take 160 mg by mouth daily.   methimazole 5 MG tablet Commonly known as: TAPAZOLE Take 1 tablet (5 mg total) by mouth daily.   multivitamin with minerals tablet Take 1 tablet by mouth daily.          REVIEW OF SYSTEMS: A comprehensive  ROS was conducted with the patient and is negative except as per HPI    OBJECTIVE:  VS: BP (!) 162/84 (BP Location: Left Arm, Patient Position: Sitting, Cuff Size: Normal)   Pulse 95   Ht 4' 11.5" (1.511 m)   Wt 168 lb 12.8 oz (76.6 kg)   SpO2 95%   BMI 33.52 kg/m    Wt Readings from Last 3 Encounters:  05/30/22 168 lb 12.8 oz (76.6 kg)  01/25/22 163 lb 9.6 oz (74.2 kg)  11/22/16 165 lb (74.8 kg)     EXAM: General: Pt appears well and is in NAD  Neck: General: Supple without adenopathy. Thyroid: Left thyroid asymmetry noted   Lungs: Clear with good BS bilat with no rales, rhonchi, or wheezes  Heart: Auscultation: RRR.  Extremities:  BL  LE: No pretibial edema normal ROM and strength.  Mental Status: Judgment, insight: Intact Orientation: Oriented to time, place, and person Mood and affect: No depression, anxiety, or agitation     DATA REVIEWED:     Latest Reference Range & Units 05/30/22 08:38  TSH 0.35 - 5.50 uIU/mL 2.37  T4,Free(Direct) 0.60 - 1.60 ng/dL 0.77    Latest Reference Range & Units 01/25/22 12:04  Sodium 135 - 145 mEq/L 140  Potassium 3.5 - 5.1 mEq/L 4.1  Chloride 96 - 112 mEq/L 102  CO2 19 - 32 mEq/L 27  Glucose 70 - 99 mg/dL 107 (H)  BUN 6 - 23 mg/dL 12  Creatinine 0.40 - 1.20 mg/dL 0.54  Calcium 8.4 - 10.5 mg/dL 9.9  Alkaline Phosphatase 39 - 117 U/L 58  Albumin 3.5 - 5.2 g/dL 4.6  AST 0 - 37 U/L 14  ALT 0 - 35 U/L 16  Total Protein 6.0 - 8.3 g/dL 7.1  Total Bilirubin 0.2 - 1.2 mg/dL 0.5  GFR >60.00 mL/min 89.50  WBC 4.0 - 10.5 K/uL 4.8  RBC 3.87 - 5.11 Mil/uL 5.02  Hemoglobin 12.0 - 15.0 g/dL 14.5  HCT 36.0 - 46.0 % 43.0  MCV 78.0 - 100.0 fl 85.8  MCHC 30.0 - 36.0 g/dL 33.7  RDW 11.5 - 15.5 % 13.5  Platelets 150.0 - 400.0 K/uL 418.0 (H)  Neutrophils 43.0 - 77.0 % 62.8  Lymphocytes 12.0 - 46.0 % 27.1  Monocytes Relative 3.0 - 12.0 % 7.9  Eosinophil 0.0 - 5.0 % 1.5  Basophil 0.0 - 3.0 % 0.7  NEUT# 1.4 - 7.7 K/uL 3.0  Lymphocyte # 0.7 - 4.0 K/uL 1.3  Monocyte # 0.1 - 1.0 K/uL 0.4  Eosinophils Absolute 0.0 - 0.7 K/uL 0.1  Basophils Absolute 0.0 - 0.1 K/uL 0.0  TSH 0.35 - 5.50 uIU/mL 0.19 (L)  Triiodothyronine (T3) 76 - 181 ng/dL 148  T4,Free(Direct) 0.60 - 1.60 ng/dL 0.98     Thyroid uptake and scan 09/06/2021  A cold nodule is identified within the left thyroid lobe within the mid to upper pole. Otherwise uniform distribution of radiotracer.   4 hour I-123 uptake = 12% (normal 5-20%)   24 hour I-123 uptake = 24% (normal 10-30%)   IMPRESSION: Normal 4 and 24 hour iodine uptake.   Cold nodule within the left thyroid lobe. Correlation with dedicated thyroid sonography is  recommended.  Thyroid ultrasound 11/17/2021  I personally examined the patient with ultrasound. Multiple pseudo nodules throughout the right thyroid lobe without a discrete right thyroid nodule.   Left thyroid lobe is markedly enlarged. There are complex cystic areas within the midportion of the left thyroid lobe with likely accounts for  the cold nodule on the thyroid scan. This cystic area was present in 2017 but there is increased fluid compared to 2017. There is not a discrete cystic nodule in this area but this area is most compatible with a TR 2 nodule since there is mixed cystic and solid composition with isoechoic echogenicity. The left mid thyroid lobe cystic area does not meet criteria for biopsy or dedicated follow-up. Again noted is markedly heterogeneous tissue in the left inferior thyroid lobe that corresponds with the previously biopsied area.   IMPRESSION: 1. Thyroid is enlarged and diffusely heterogeneous without discrete suspicious nodules. The cold area on the previous thyroid scan likely corresponds with the complex cystic area within the left mid thyroid lobe. No areas meet criteria for ultrasound-guided biopsy at this time.    FNA left lobe 12/21/2015  THYROID, LEFT LOBE, FINE NEEDLE ASPIRATION (SPECIMEN 1 OF 1, COLLECTED ON 12/21/2015): CONSISTENT WITH BENIGN FOLLICULAR NODULE (BETHESDA CATEGORY II). ROBERT HILLARD ASSESSMENT/PLAN/RECOMMENDATIONS:   Hyperthyroidism    -Patient is clinically euthyroid at this time -No local neck symptoms -Thyroid uptake and scan showed increased uptake at the upper lobes bilaterally -TFTs are normal but will reduce the dose as below   Medications : Decrease methimazole 5 mg, half a tablet on Saturday and Sunday, and 1 tablet the rest of the week    2.  Multinodular goiter:   -Status post FNA with benign cytology in 2017.  Patient had repeat thyroid ultrasound showing cystic/solid left thyroid nodule.  Her PCP  attempted to have this biopsy but did not meet criteria per TI-RADS -I did explain to the patient that cold nodules do carry a high risk for thyroid cancer, and without an FNA we would not be able to proceed with radioactive iodine ablation if needed. -At this time we have opted to serially monitor the area through ultrasound imaging and intervene when necessary   Follow-up in 4 months   Signed electronically by: Mack Guise, MD  [facility] End[facility]in[facility]  [facility] [address] [place], [place] [address] Phone: [phone] FAX: [phone]   CC: Gaynelle Arabian, MD [address] [place] [address] Phone: [phone] Fax: [phone]   Return to Endocrinology clinic as below: Future Appointments  Date Time Provider [place]  12/01/2022  8:10 AM Kialee Kham, Melanie Crazier, MD [id number] None

## 2022-05-30 NOTE — Addendum Note (Signed)
Addended by: Dorita Sciara on: 05/30/2022 12:39 PM   Modules accepted: Orders

## 2022-05-31 LAB — T3: T3, Total: 136 ng/dL (ref 76–181)

## 2022-06-01 DIAGNOSIS — H35033 Hypertensive retinopathy, bilateral: Secondary | ICD-10-CM | POA: Diagnosis not present

## 2022-06-01 DIAGNOSIS — Z0100 Encounter for examination of eyes and vision without abnormal findings: Secondary | ICD-10-CM | POA: Diagnosis not present

## 2022-06-01 DIAGNOSIS — I10 Essential (primary) hypertension: Secondary | ICD-10-CM | POA: Diagnosis not present

## 2022-06-01 DIAGNOSIS — Z961 Presence of intraocular lens: Secondary | ICD-10-CM | POA: Diagnosis not present

## 2022-07-17 DIAGNOSIS — Z86010 Personal history of colonic polyps: Secondary | ICD-10-CM | POA: Diagnosis not present

## 2022-07-17 DIAGNOSIS — E0590 Thyrotoxicosis, unspecified without thyrotoxic crisis or storm: Secondary | ICD-10-CM | POA: Diagnosis not present

## 2022-07-17 DIAGNOSIS — I10 Essential (primary) hypertension: Secondary | ICD-10-CM | POA: Diagnosis not present

## 2022-07-17 DIAGNOSIS — E785 Hyperlipidemia, unspecified: Secondary | ICD-10-CM | POA: Diagnosis not present

## 2022-07-17 DIAGNOSIS — Z1331 Encounter for screening for depression: Secondary | ICD-10-CM | POA: Diagnosis not present

## 2022-07-17 DIAGNOSIS — I700 Atherosclerosis of aorta: Secondary | ICD-10-CM | POA: Diagnosis not present

## 2022-07-17 DIAGNOSIS — G720 Drug-induced myopathy: Secondary | ICD-10-CM | POA: Diagnosis not present

## 2022-07-17 DIAGNOSIS — R7301 Impaired fasting glucose: Secondary | ICD-10-CM | POA: Diagnosis not present

## 2022-07-17 DIAGNOSIS — Z0000 Encounter for general adult medical examination without abnormal findings: Secondary | ICD-10-CM | POA: Diagnosis not present

## 2022-07-24 ENCOUNTER — Other Ambulatory Visit: Payer: Self-pay | Admitting: Internal Medicine

## 2022-08-29 IMAGING — US US THYROID
1 series · 13 of 25 positions shown · non-contrast
Comparison: Nuclear medicine thyroid uptake September 2021, thyroid
ultrasound 3410

CLINICAL DATA: Other. Nuclear medicine thyroid uptake exam
demonstrating cold nodule in left thyroid lobe

EXAM:
THYROID ULTRASOUND
TECHNIQUE: Ultrasound examination of the thyroid gland and adjacent soft
tissues was performed.

[Series 1: us thyroid · 0.06mm/px · 13 of 78 slices shown]
[im 1/78]
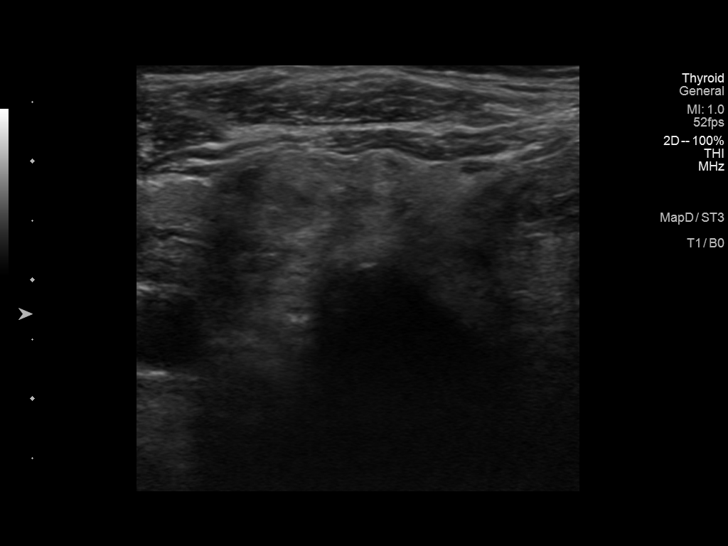
[im 7/78]
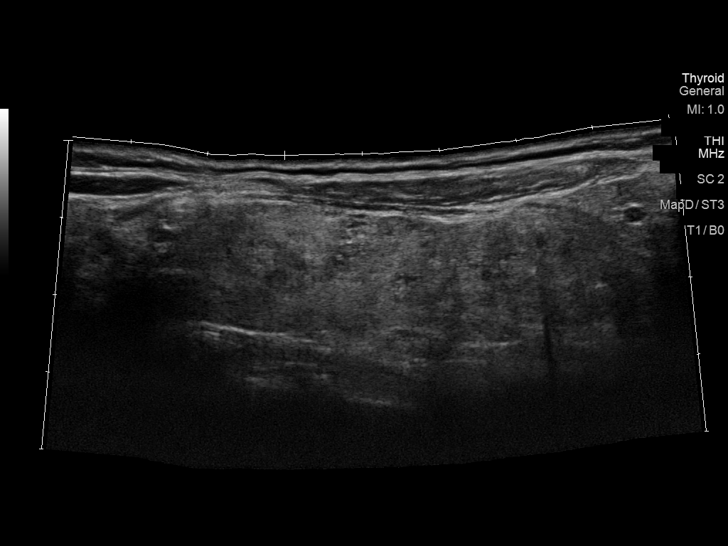
[im 13/78]
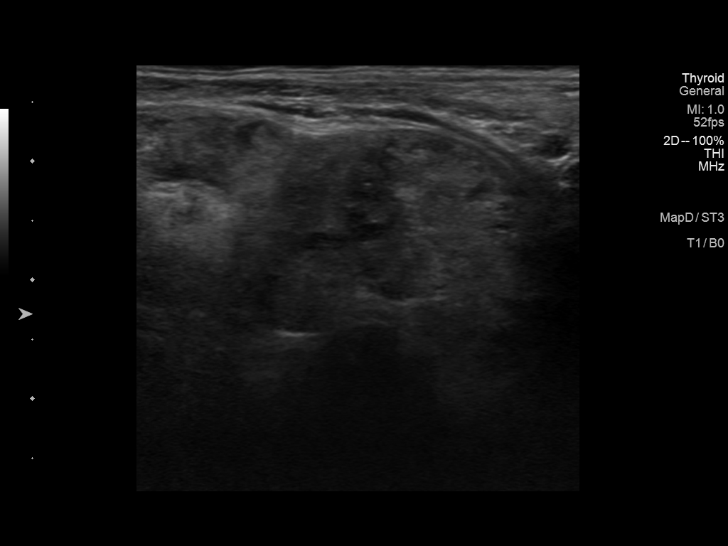
[im 20/78]
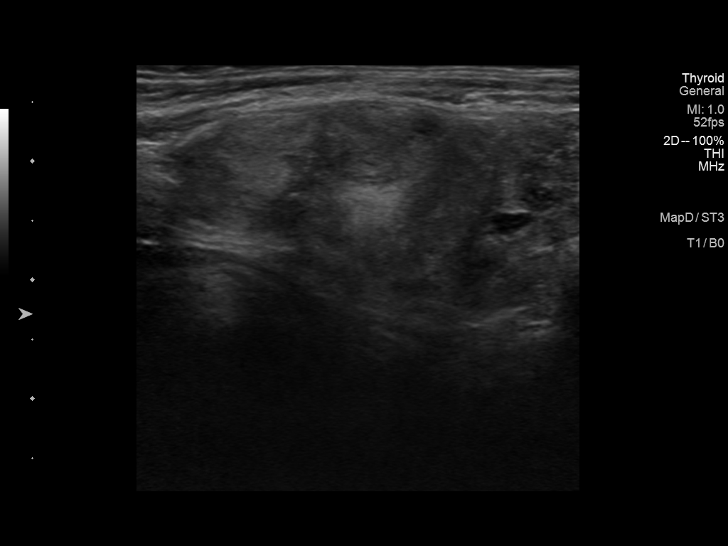
[im 26/78]
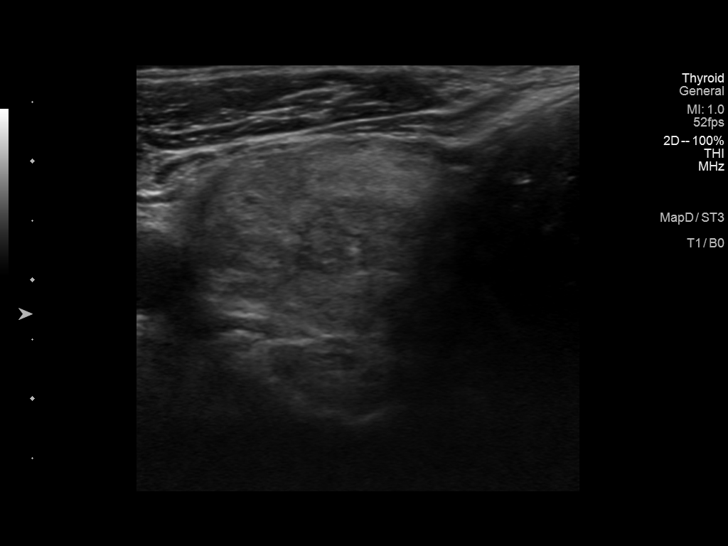
[im 33/78]
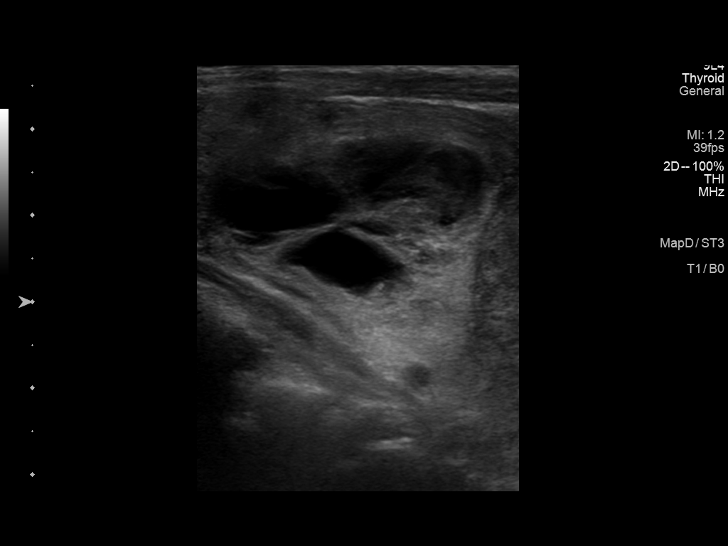
[im 39/78]
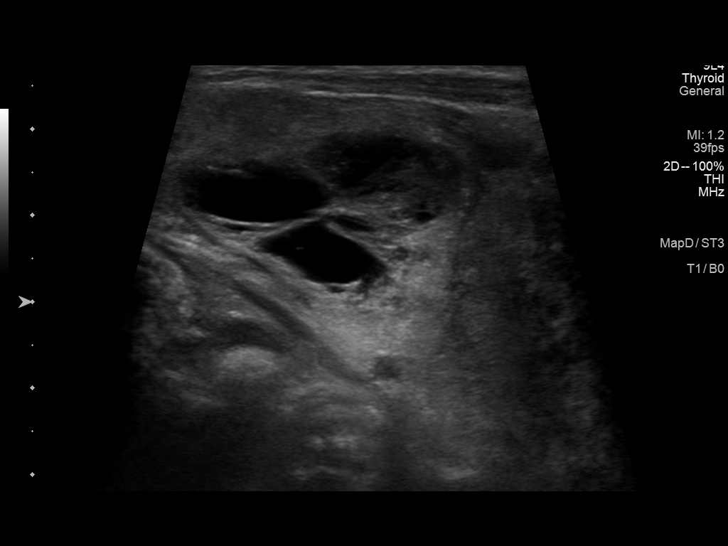
[im 45/78]
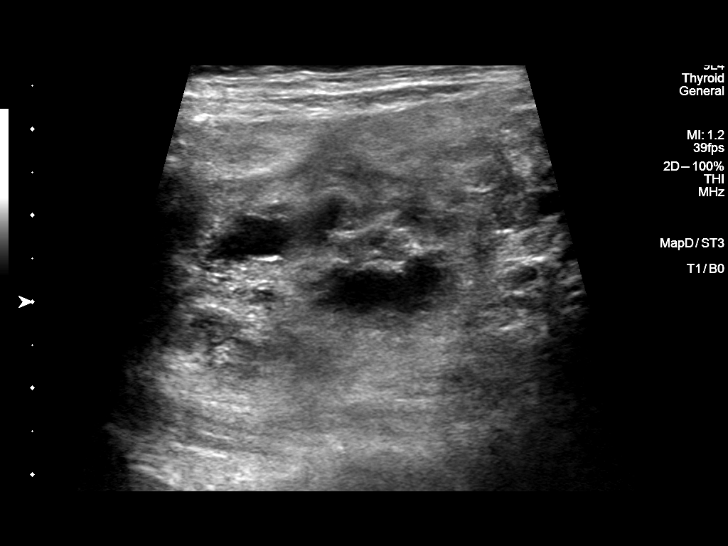
[im 52/78]
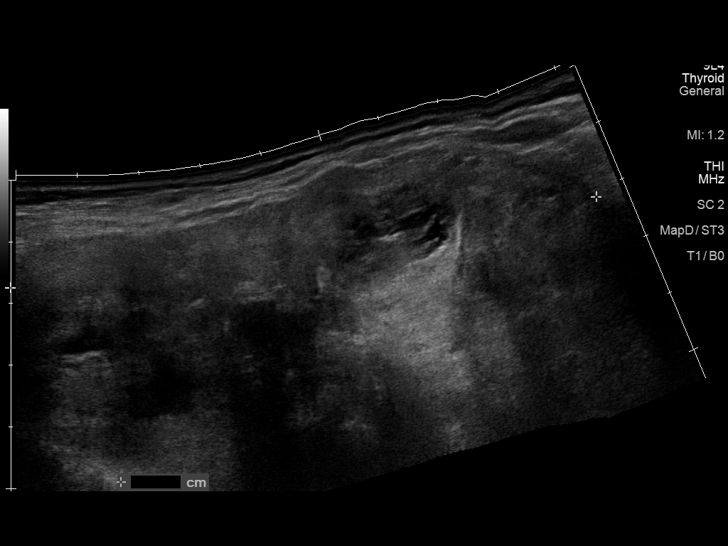
[im 58/78]
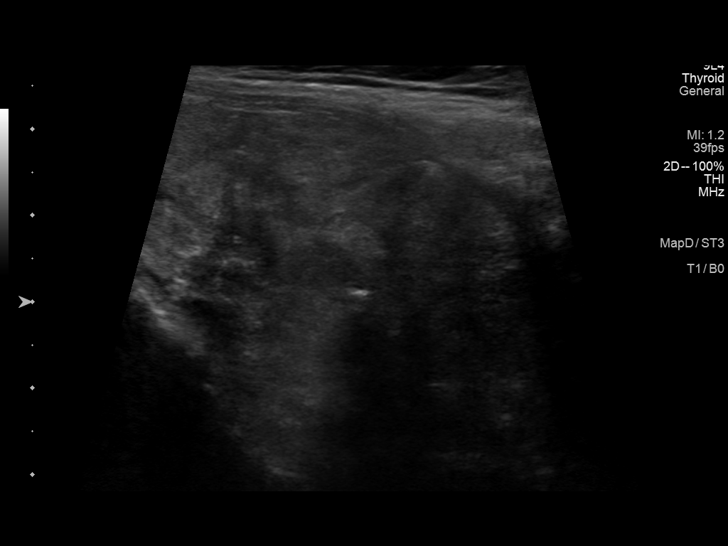
[im 65/78]
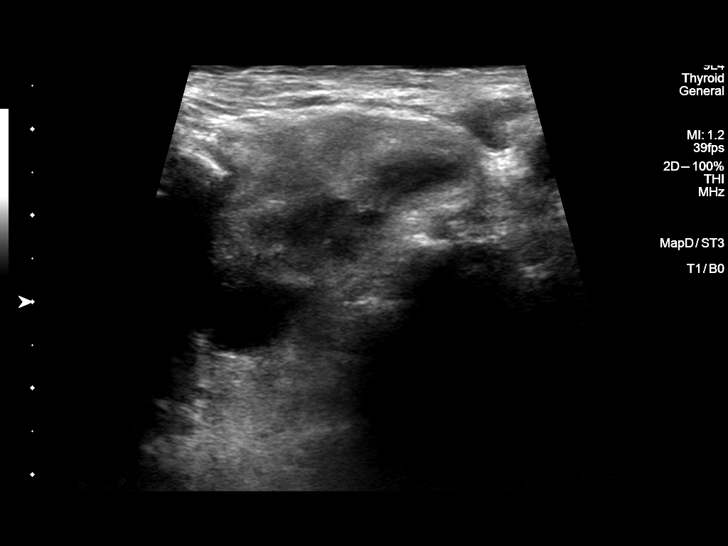
[im 71/78]
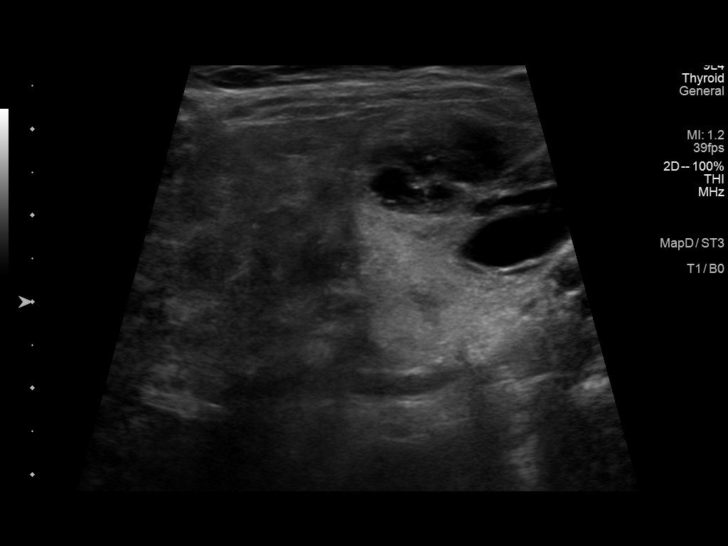
[im 78/78]
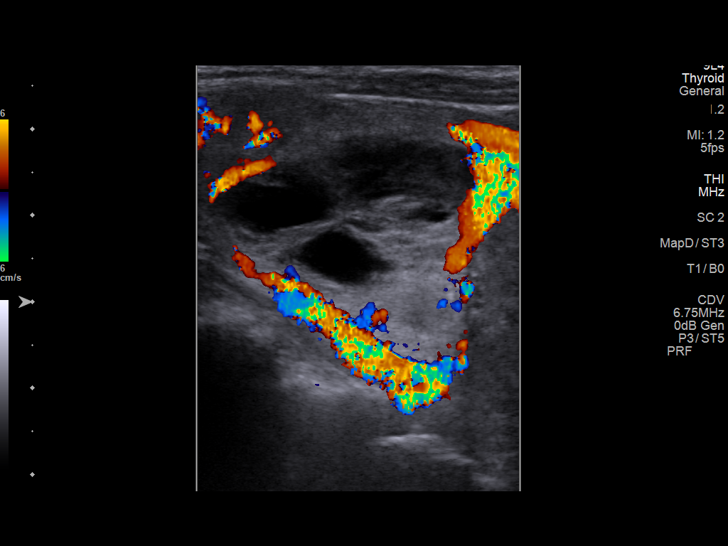

[13 of 25 positions shown; findings below may reference images not displayed]

FINDINGS: Parenchymal Echotexture: Markedly heterogenous

Isthmus: 1 cm

Right lobe: 7 x 2 x 2.4 cm

Left lobe: 9.6 x 6.2 x 6.9 cm

_________________________________________________________

Nodule labeled 1 refers to a slightly distinct heterogeneous but
slightly hypoechoic area in the inferior right thyroid lobe
measuring 1.8 x 1.5 x 1.2 cm, possibly pseudo nodule versus true
nodule (TR 4). Assuming this represents a true lesion, **Given size
(>/= 1.5 cm) and appearance, fine needle aspiration of this
moderately suspicious nodule should be considered based on TI-RADS
criteria.

The left thyroid gland is again noted to be markedly enlarged and
heterogeneous, and makes determination of true nodules within the
left thyroid lobe very challenging. The best correlate to the
findings on the nuclear medicine uptake exam appears to be the
nodule labeled 3 in the mid left thyroid lobe which is a mixed solid
and cystic nodule with relatively hyperechoic echogenicity of its
solid components (TR 2), and measures 3.9 x 3.4 x 3.3 cm.
IMPRESSION: 1. The left thyroid gland is markedly enlarged and heterogeneous,
and provides for difficult interpretation of still images. There is
a slightly distinct solid and cystic nodule versus pseudonodule
within the mid left thyroid lobe which measures 3.9 x 3.4 x 3.3 cm,
and which could correspond to the cold nodule identified on recent
iodine uptake exam. Although it does not technically meet criteria
for biopsy based on ACR TI-RADS alone, given the recent iodine
uptake scan, referral for biopsy and evaluation with real-time
sonography is recommended.
2. Additionally, there is a 1.8 cm nodule versus pseudo nodule in
the inferior right thyroid lobe. This could be evaluated with
real-time sonography at the same time as the finding above, and
determination for biopsy could be made at that time.

## 2022-12-01 ENCOUNTER — Ambulatory Visit: Payer: Medicare HMO | Admitting: Internal Medicine

## 2022-12-01 ENCOUNTER — Encounter: Payer: Self-pay | Admitting: Internal Medicine

## 2022-12-01 VITALS — BP 124/82 | HR 96 | Ht 59.5 in | Wt 164.8 lb

## 2022-12-01 DIAGNOSIS — E0590 Thyrotoxicosis, unspecified without thyrotoxic crisis or storm: Secondary | ICD-10-CM

## 2022-12-01 LAB — T4, FREE: Free T4: 0.9 ng/dL (ref 0.60–1.60)

## 2022-12-01 LAB — TSH: TSH: 1.39 u[IU]/mL (ref 0.35–5.50)

## 2022-12-01 MED ORDER — METHIMAZOLE 5 MG PO TABS
5.0000 mg | ORAL_TABLET | ORAL | 3 refills | Status: DC
Start: 2022-12-01 — End: 2023-06-08

## 2022-12-01 NOTE — Progress Notes (Signed)
Name: Suzanne Trevino  MRN/ DOB: [id number], 10/04/1945    Age/ Sex: 77 y.o., female    PCP: Gaynelle Arabian, MD   Reason for Endocrinology Evaluation: Hyperthyroidism     Date of Initial Endocrinology Evaluation: 01/25/2022    HPI: Suzanne Trevino is a 77 y.o. female with a past medical history of HTN. The patient presented for initial endocrinology clinic visit on 01/25/2022 for consultative assistance with her Hyperthyroidism.   During evaluation for weight loss in September 2022 she was noted with hyperthyroidism with a suppressed TSH at 0.08 uIU/mL and elevated free T4 at 1.39 (0.61-1.12) and undetectable anti-TPO antibodies.    An uptake and scan was performed on 09/06/2021 revealing normal uptake at 24% of I-123 with a left lobe cold nodule. Thyroid ultrasound showed a complex cystic area on the left lobe which was biopsied in 2017 with benign cytology.   On her initial visit to our clinic she had an undetectable TRAb  Repeat thyroid ultrasound on November 17, 2021 revealed heterogeneous thyroid gland without discrete nodules.  On her initial visit to our clinic her TSH was low at 0.19 u U IU/mL, normal T3 and free T4.  She was started on methimazole at the time  No prior exposure to radiation  No prior dx of osteoporosis nor cardiac arrythmia pp  Sister with hypothyroidism   SUBJECTIVE:     Today (12/01/22): Suzanne Trevino is here for follow-up on multinodular goiter and hyperthyroidism.   Pt has been noted with weight loss , intentional  Denies local neck swelling  Denies palpitations Denies tremors Denies  diarrhea or loose stools  No Biotin intake    Methimazole 5 mg, half a tablet on Saturday and Sunday, and 1 tablet the rest of the week    HISTORY:  Past Medical History:  Past Medical History:  Diagnosis Date   Hypertension    Past Surgical History:  Past Surgical History:  Procedure Laterality Date   CESAREAN SECTION     CHOLECYSTECTOMY N/A  11/22/2016   Procedure: LAPAROSCOPIC CHOLECYSTECTOMY;  Surgeon: Judeth Horn, MD;  Location: [facility];  Service: General;  Laterality: N/A;   HIP ARTHROSCOPY Right     Social History:  reports that she quit smoking about 54 years ago. Her smoking use included cigarettes. She has never used smokeless tobacco. She reports that she does not drink alcohol and does not use drugs. Family History: family history is not on file.   HOME MEDICATIONS: Allergies as of 12/01/2022       Reactions   Altace [ramipril]    Tongue swelling   Vytorin [ezetimibe-simvastatin] Anaphylaxis   Neosporin [neomycin-bacitracin Zn-polymyx] Other (See Comments)   Eye burning   Sulfa Antibiotics    unknown   Triamterene    Cramps   Zetia [ezetimibe] Other (See Comments)   Chest pain   Statins Rash        Medication List        Accurate as of December 01, 2022  8:14 AM. If you have any questions, ask your nurse or doctor.          amLODipine 10 MG tablet Commonly known as: NORVASC Take 10 mg by mouth daily. What changed: Another medication with the same name was removed. Continue taking this medication, and follow the directions you see here. Changed by: Dorita Sciara, MD   Calcium-Vitamin D3 600-400 MG-UNIT Caps Take 1 tablet by mouth daily.   fenofibrate 160 MG tablet Take  160 mg by mouth daily.   methimazole 5 MG tablet Commonly known as: TAPAZOLE TAKE 1 TABLET EVERY DAY   multivitamin with minerals tablet Take 1 tablet by mouth daily.          REVIEW OF SYSTEMS: A comprehensive ROS was conducted with the patient and is negative except as per HPI    OBJECTIVE:  VS: BP 124/82 (BP Location: Left Arm, Patient Position: Sitting, Cuff Size: Small)   Pulse 96   Ht 4' 11.5" (1.511 m)   Wt 164 lb 12.8 oz (74.8 kg)   SpO2 99%   BMI 32.73 kg/m    Wt Readings from Last 3 Encounters:  12/01/22 164 lb 12.8 oz (74.8 kg)  05/30/22 168 lb 12.8 oz (76.6 kg)  01/25/22 163 lb 9.6 oz (74.2  kg)     EXAM: General: Pt appears well and is in NAD  Neck: General: Supple without adenopathy. Thyroid: Left thyroid asymmetry noted   Lungs: Clear with good BS bilat with no rales, rhonchi, or wheezes  Heart: Auscultation: RRR.  Extremities:  BL LE: No pretibial edema normal ROM and strength.  Mental Status: Judgment, insight: Intact Orientation: Oriented to time, place, and person Mood and affect: No depression, anxiety, or agitation     DATA REVIEWED:  Latest Reference Range & Units 12/01/22 08:33  TSH 0.35 - 5.50 uIU/mL 1.39  T4,Free(Direct) 0.60 - 1.60 ng/dL 0.90     Latest Reference Range & Units 01/25/22 12:04  Sodium 135 - 145 mEq/L 140  Potassium 3.5 - 5.1 mEq/L 4.1  Chloride 96 - 112 mEq/L 102  CO2 19 - 32 mEq/L 27  Glucose 70 - 99 mg/dL 107 (H)  BUN 6 - 23 mg/dL 12  Creatinine 0.40 - 1.20 mg/dL 0.54  Calcium 8.4 - 10.5 mg/dL 9.9  Alkaline Phosphatase 39 - 117 U/L 58  Albumin 3.5 - 5.2 g/dL 4.6  AST 0 - 37 U/L 14  ALT 0 - 35 U/L 16  Total Protein 6.0 - 8.3 g/dL 7.1  Total Bilirubin 0.2 - 1.2 mg/dL 0.5  GFR >60.00 mL/min 89.50  WBC 4.0 - 10.5 K/uL 4.8  RBC 3.87 - 5.11 Mil/uL 5.02  Hemoglobin 12.0 - 15.0 g/dL 14.5  HCT 36.0 - 46.0 % 43.0  MCV 78.0 - 100.0 fl 85.8  MCHC 30.0 - 36.0 g/dL 33.7  RDW 11.5 - 15.5 % 13.5  Platelets 150.0 - 400.0 K/uL 418.0 (H)  Neutrophils 43.0 - 77.0 % 62.8  Lymphocytes 12.0 - 46.0 % 27.1  Monocytes Relative 3.0 - 12.0 % 7.9  Eosinophil 0.0 - 5.0 % 1.5  Basophil 0.0 - 3.0 % 0.7  NEUT# 1.4 - 7.7 K/uL 3.0  Lymphocyte # 0.7 - 4.0 K/uL 1.3  Monocyte # 0.1 - 1.0 K/uL 0.4  Eosinophils Absolute 0.0 - 0.7 K/uL 0.1  Basophils Absolute 0.0 - 0.1 K/uL 0.0  TSH 0.35 - 5.50 uIU/mL 0.19 (L)  Triiodothyronine (T3) 76 - 181 ng/dL 148  T4,Free(Direct) 0.60 - 1.60 ng/dL 0.98     Thyroid uptake and scan 09/06/2021  A cold nodule is identified within the left thyroid lobe within the mid to upper pole. Otherwise uniform distribution of  radiotracer.   4 hour I-123 uptake = 12% (normal 5-20%)   24 hour I-123 uptake = 24% (normal 10-30%)   IMPRESSION: Normal 4 and 24 hour iodine uptake.   Cold nodule within the left thyroid lobe. Correlation with dedicated thyroid sonography is recommended.  Thyroid ultrasound 11/17/2021  I  personally examined the patient with ultrasound. Multiple pseudo nodules throughout the right thyroid lobe without a discrete right thyroid nodule.   Left thyroid lobe is markedly enlarged. There are complex cystic areas within the midportion of the left thyroid lobe with likely accounts for the cold nodule on the thyroid scan. This cystic area was present in 2017 but there is increased fluid compared to 2017. There is not a discrete cystic nodule in this area but this area is most compatible with a TR 2 nodule since there is mixed cystic and solid composition with isoechoic echogenicity. The left mid thyroid lobe cystic area does not meet criteria for biopsy or dedicated follow-up. Again noted is markedly heterogeneous tissue in the left inferior thyroid lobe that corresponds with the previously biopsied area.   IMPRESSION: 1. Thyroid is enlarged and diffusely heterogeneous without discrete suspicious nodules. The cold area on the previous thyroid scan likely corresponds with the complex cystic area within the left mid thyroid lobe. No areas meet criteria for ultrasound-guided biopsy at this time.    FNA left lobe 12/21/2015  THYROID, LEFT LOBE, FINE NEEDLE ASPIRATION (SPECIMEN 1 OF 1, COLLECTED ON 12/21/2015): CONSISTENT WITH BENIGN FOLLICULAR NODULE (BETHESDA CATEGORY II). ROBERT HILLARD ASSESSMENT/PLAN/RECOMMENDATIONS:   Hyperthyroidism    -Patient is clinically euthyroid  -No local neck symptoms -Thyroid uptake and scan showed increased uptake at the upper lobes bilaterally -TFTs are normal   Medications : Continue methimazole 5 mg, half a tablet on Saturday and Sunday, and  1 tablet the rest of the week    2.  Multinodular goiter:   -Status post FNA with benign cytology in 2017.  Patient had repeat thyroid ultrasound showing cystic/solid left thyroid nodule 11/17/2021 that did not meet criteria for follow-up -At this time we have opted to monitor her clinically and we will have low threshold for repeating the ultrasound   Follow-up in 6 months   Signed electronically by: Mack Guise, MD  [facility]  [address] [place], [place] [address] Phone: [phone] FAX: [phone]   CC: Gaynelle Arabian, MD [address] [place] [address] Phone: [phone] Fax: [phone]   Return to Endocrinology clinic as below: No future appointments.

## 2022-12-02 LAB — T3: T3, Total: 154 ng/dL (ref 76–181)

## 2022-12-06 ENCOUNTER — Encounter: Payer: Self-pay | Admitting: Internal Medicine

## 2023-02-13 DIAGNOSIS — Z1231 Encounter for screening mammogram for malignant neoplasm of breast: Secondary | ICD-10-CM | POA: Diagnosis not present

## 2023-04-27 DIAGNOSIS — Z96641 Presence of right artificial hip joint: Secondary | ICD-10-CM | POA: Diagnosis not present

## 2023-04-27 DIAGNOSIS — M25552 Pain in left hip: Secondary | ICD-10-CM | POA: Diagnosis not present

## 2023-04-27 DIAGNOSIS — Z96652 Presence of left artificial knee joint: Secondary | ICD-10-CM | POA: Diagnosis not present

## 2023-05-02 DIAGNOSIS — M25552 Pain in left hip: Secondary | ICD-10-CM | POA: Diagnosis not present

## 2023-06-07 NOTE — Progress Notes (Signed)
Name: Suzanne Trevino  MRN/ DOB: [id number], [birth year]    Age/ Sex: 77 y.o., female    PCP: Blair Heys, MD   Reason for Endocrinology Evaluation: Hyperthyroidism     Date of Initial Endocrinology Evaluation: 01/25/2022    HPI: [name] is a 77 y.o. female with a past medical history of HTN. The patient presented for initial endocrinology clinic visit on 01/25/2022 for consultative assistance with her Hyperthyroidism.   During evaluation for weight loss in September 2022 she was noted with hyperthyroidism with a suppressed TSH at 0.08 uIU/mL and elevated free T4 at 1.39 (0.61-1.12) and undetectable anti-TPO antibodies.    An uptake and scan was performed on 09/06/2021 revealing normal uptake at 24% of I-123 with a left lobe cold nodule. Thyroid ultrasound showed a complex cystic area on the left lobe which was biopsied in 2017 with benign cytology.   On her initial visit to our clinic she had an undetectable TRAb  Repeat thyroid ultrasound on November 17, 2021 revealed heterogeneous thyroid gland without discrete nodules.  On her initial visit to our clinic her TSH was low at 0.19 u U IU/mL, normal T3 and free T4.  She was started on methimazole at the time  No prior exposure to radiation  No prior dx of osteoporosis nor cardiac arrythmia pp  Sister with hypothyroidism   SUBJECTIVE:     Today (06/08/23): [name] is here for follow-up on multinodular goiter and hyperthyroidism.  Scheduled for  left hip surgery 08/2023, she already had multiple right hip sx  Patient has been noted with weight loss Denies local neck swelling  Denies palpitations Denies tremors Denies  diarrhea  No Biotin intake    Methimazole 5 mg, half a tablet on Saturday and Sunday, and 1 tablet the rest of the week    HISTORY:  Past Medical History:  Past Medical History:  Diagnosis Date   Hypertension    Past Surgical History:  Past Surgical History:  Procedure  Laterality Date   CESAREAN SECTION     CHOLECYSTECTOMY N/A 11/22/2016   Procedure: LAPAROSCOPIC CHOLECYSTECTOMY;  Surgeon: Jimmye Norman, MD;  Location[facility];  Service: General;  Laterality: N/A;   HIP ARTHROSCOPY Right     Social History:  reports that she quit smoking about 54 years ago. Her smoking use included cigarettes. She has never used smokeless tobacco. She reports that she does not drink alcohol and does not use drugs. Family History: family history is not on file.   HOME MEDICATIONS: Allergies as of 06/08/2023       Reactions   Altace [ramipril]    Tongue swelling   Vytorin [ezetimibe-simvastatin] Anaphylaxis   Neosporin [neomycin-bacitracin Zn-polymyx] Other (See Comments)   Eye burning   Sulfa Antibiotics    unknown   Triamterene    Cramps   Zetia [ezetimibe] Other (See Comments)   Chest pain   Statins Rash        Medication List        Accurate as of June 08, 2023  8:15 AM. If you have any questions, ask your nurse or doctor.          amLODipine 10 MG tablet Commonly known as: NORVASC Take 10 mg by mouth daily.   Calcium-Vitamin D3 600-400 MG-UNIT Caps Take 1 tablet by mouth daily.   fenofibrate 160 MG tablet Take 160 mg by mouth daily.   methimazole 5 MG tablet Commonly known as: TAPAZOLE Take 1 tablet (5  mg total) by mouth as directed. Half a tablet on Saturday, half a tablet on Sunday, and 1 tablet the rest of the week   multivitamin with minerals tablet Take 1 tablet by mouth daily.          REVIEW OF SYSTEMS: A comprehensive ROS was conducted with the patient and is negative except as per HPI    OBJECTIVE:  VS: BP 128/82 (BP Location: Left Arm, Patient Position: Sitting, Cuff Size: Large)   Pulse 64   Ht 4' 11.5" (1.511 m)   Wt 159 lb (72.1 kg)   SpO2 97%   BMI 31.58 kg/m    Wt Readings from Last 3 Encounters:  06/08/23 159 lb (72.1 kg)  12/01/22 164 lb 12.8 oz (74.8 kg)  05/30/22 168 lb 12.8 oz (76.6 kg)      EXAM: General: Pt appears well and is in NAD  Neck: General: Supple without adenopathy. Thyroid: Left thyroid asymmetry noted   Lungs: Clear with good BS bilat with no rales, rhonchi, or wheezes  Heart: Auscultation: RRR.  Extremities:  BL LE: No pretibial edema normal ROM and strength.  Mental Status: Judgment, insight: Intact Orientation: Oriented to time, place, and person Mood and affect: No depression, anxiety, or agitation     DATA REVIEWED:   Latest Reference Range & Units 06/08/23 08:20  TSH 0.35 - 5.50 uIU/mL 1.32  T4,Free(Direct) 0.60 - 1.60 ng/dL 1.61    Thyroid uptake and scan 09/06/2021  A cold nodule is identified within the left thyroid lobe within the mid to upper pole. Otherwise uniform distribution of radiotracer.   4 hour I-123 uptake = 12% (normal 5-20%)   24 hour I-123 uptake = 24% (normal 10-30%)   IMPRESSION: Normal 4 and 24 hour iodine uptake.   Cold nodule within the left thyroid lobe. Correlation with dedicated thyroid sonography is recommended.  Thyroid ultrasound 11/17/2021  I personally examined the patient with ultrasound. Multiple pseudo nodules throughout the right thyroid lobe without a discrete right thyroid nodule.   Left thyroid lobe is markedly enlarged. There are complex cystic areas within the midportion of the left thyroid lobe with likely accounts for the cold nodule on the thyroid scan. This cystic area was present in 2017 but there is increased fluid compared to 2017. There is not a discrete cystic nodule in this area but this area is most compatible with a TR 2 nodule since there is mixed cystic and solid composition with isoechoic echogenicity. The left mid thyroid lobe cystic area does not meet criteria for biopsy or dedicated follow-up. Again noted is markedly heterogeneous tissue in the left inferior thyroid lobe that corresponds with the previously biopsied area.   IMPRESSION: 1. Thyroid is enlarged and  diffusely heterogeneous without discrete suspicious nodules. The cold area on the previous thyroid scan likely corresponds with the complex cystic area within the left mid thyroid lobe. No areas meet criteria for ultrasound-guided biopsy at this time.    FNA left lobe 12/21/2015  THYROID, LEFT LOBE, FINE NEEDLE ASPIRATION (SPECIMEN 1 OF 1, COLLECTED ON 12/21/2015): CONSISTENT WITH BENIGN FOLLICULAR NODULE (BETHESDA CATEGORY II). ROBERT HILLARD ASSESSMENT/PLAN/RECOMMENDATIONS:   Hyperthyroidism    -Patient is clinically euthyroid  -No local neck symptoms -Thyroid uptake and scan showed increased uptake at the upper lobes bilaterally -TFTs remain normal     Medications : Continue methimazole 5 mg, half a tablet on Saturday and Sunday, and 1 tablet the rest of the week    2.  Multinodular goiter:   -  Status post FNA with benign cytology in 2017.  Patient had repeat thyroid ultrasound showing cystic/solid left thyroid nodule 11/17/2021 that did not meet criteria for follow-up -At this time we have opted to monitor her clinically and we will have low threshold for repeating the ultrasound   Follow-up in 6 months   Signed electronically by: Lyndle Herrlich, MD  [facility]  [facility] [address] [place] [place], [place] [address] Phone: [phone] FAX: [phone]   CC: Blair Heys, MD [address] [place] [address] Phone: [phone] Fax: [phone]   Return to Endocrinology clinic as below: No future appointments.

## 2023-06-08 ENCOUNTER — Ambulatory Visit: Payer: Medicare HMO | Admitting: Internal Medicine

## 2023-06-08 ENCOUNTER — Encounter: Payer: Self-pay | Admitting: Internal Medicine

## 2023-06-08 VITALS — BP 128/82 | HR 64 | Ht 59.5 in | Wt 159.0 lb

## 2023-06-08 DIAGNOSIS — E041 Nontoxic single thyroid nodule: Secondary | ICD-10-CM | POA: Diagnosis not present

## 2023-06-08 DIAGNOSIS — E0590 Thyrotoxicosis, unspecified without thyrotoxic crisis or storm: Secondary | ICD-10-CM | POA: Diagnosis not present

## 2023-06-08 LAB — TSH: TSH: 1.32 u[IU]/mL (ref 0.35–5.50)

## 2023-06-08 LAB — T4, FREE: Free T4: 1.02 ng/dL (ref 0.60–1.60)

## 2023-06-08 MED ORDER — METHIMAZOLE 5 MG PO TABS: 5.0000 mg | ORAL_TABLET | ORAL | 3 refills | Status: DC

## 2023-06-14 DIAGNOSIS — M1612 Unilateral primary osteoarthritis, left hip: Secondary | ICD-10-CM | POA: Diagnosis not present

## 2023-07-23 DIAGNOSIS — E0590 Thyrotoxicosis, unspecified without thyrotoxic crisis or storm: Secondary | ICD-10-CM | POA: Diagnosis not present

## 2023-07-23 DIAGNOSIS — R7301 Impaired fasting glucose: Secondary | ICD-10-CM | POA: Diagnosis not present

## 2023-07-23 DIAGNOSIS — I700 Atherosclerosis of aorta: Secondary | ICD-10-CM | POA: Diagnosis not present

## 2023-07-23 DIAGNOSIS — Z0000 Encounter for general adult medical examination without abnormal findings: Secondary | ICD-10-CM | POA: Diagnosis not present

## 2023-07-23 DIAGNOSIS — E785 Hyperlipidemia, unspecified: Secondary | ICD-10-CM | POA: Diagnosis not present

## 2023-07-23 DIAGNOSIS — M810 Age-related osteoporosis without current pathological fracture: Secondary | ICD-10-CM | POA: Diagnosis not present

## 2023-07-23 DIAGNOSIS — I10 Essential (primary) hypertension: Secondary | ICD-10-CM | POA: Diagnosis not present

## 2023-07-23 DIAGNOSIS — E041 Nontoxic single thyroid nodule: Secondary | ICD-10-CM | POA: Diagnosis not present

## 2023-07-25 NOTE — H&P (Signed)
TOTAL HIP ADMISSION H&P  Patient is admitted for left total hip arthroplasty.  Subjective:  Chief Complaint: Left hip pain  HPI: Suzanne Trevino, 77 y.o. female, has a history of pain and functional disability in the left hip due to arthritis and patient has failed non-surgical conservative treatments for greater than 12 weeks to include NSAID's and/or analgesics, corticosteriod injections, and activity modification. Onset of symptoms was gradual, starting 1 years ago with rapidlly worsening course since that time. The patient noted no past surgery on the left hip. Patient currently rates pain in the left hip at 8 out of 10 with activity. Patient has night pain, worsening of pain with activity and weight bearing, pain that interfers with activities of daily living, and pain with passive range of motion. Patient has evidence of  severe bone-on-bone arthritis of the left hip with subchondral cyst formation and osteophyte formation  by imaging studies. This condition presents safety issues increasing the risk of falls. There is no current active infection.  Patient Active Problem List   Diagnosis Date Noted   Left thyroid nodule 01/25/2022   Hyperthyroidism 01/25/2022   Cholecystitis 11/22/2016    Past Medical History:  Diagnosis Date   Hypertension     Past Surgical History:  Procedure Laterality Date   CESAREAN SECTION     CHOLECYSTECTOMY N/A 11/22/2016   Procedure: LAPAROSCOPIC CHOLECYSTECTOMY;  Surgeon: Jimmye Norman, MD;  Location: [facility];  Service: General;  Laterality: N/A;   HIP ARTHROSCOPY Right     Prior to Admission medications   Medication Sig Start Date End Date Taking? Authorizing Provider  amLODipine (NORVASC) 10 MG tablet Take 10 mg by mouth daily. 11/13/22   [provider]  Calcium Carb-Cholecalciferol (CALCIUM-VITAMIN D3) 600-400 MG-UNIT CAPS Take 1 tablet by mouth daily.    [provider]  fenofibrate 160 MG tablet Take 160 mg by mouth daily.     [provider]  methimazole (TAPAZOLE) 5 MG tablet Take 1 tablet (5 mg total) by mouth as directed. Half a tablet on Saturday, half a tablet on Sunday, and 1 tablet the rest of the week 06/08/23   Shamleffer, Konrad Dolores, MD  Multiple Vitamins-Minerals (MULTIVITAMIN WITH MINERALS) tablet Take 1 tablet by mouth daily.    [provider]    Allergies  Allergen Reactions   Altace [Ramipril]     Tongue swelling   Vytorin [Ezetimibe-Simvastatin] Anaphylaxis   Neosporin [Neomycin-Bacitracin Zn-Polymyx] Other (See Comments)    Eye burning    Sulfa Antibiotics     unknown   Triamterene     Cramps    Zetia [Ezetimibe] Other (See Comments)    Chest pain    Statins Rash    Social History   Socioeconomic History   Marital status: Divorced    Spouse name: Not on file   Number of children: Not on file   Years of education: Not on file   Highest education level: Not on file  Occupational History   Not on file  Tobacco Use   Smoking status: Former    Current packs/day: 0.00    Types: Cigarettes    Quit date: 10/02/1968    Years since quitting: 54.8   Smokeless tobacco: Never  Substance and Sexual Activity   Alcohol use: No   Drug use: No   Sexual activity: Not on file  Other Topics Concern   Not on file  Social History Narrative   Not on file   Social Determinants of Health  Financial Resource Strain: Not on file  Food Insecurity: Not on file  Transportation Needs: Not on file  Physical Activity: Not on file  Stress: Not on file  Social Connections: Not on file  Intimate Partner Violence: Not on file    Tobacco Use: Medium Risk (06/08/2023)   Patient History    Smoking Tobacco Use: Former    Smokeless Tobacco Use: Never    Passive Exposure: Not on file   Social History   Substance and Sexual Activity  Alcohol Use No    No family history on file.  Review of Systems  Constitutional:  Negative for chills and fever.  HENT:  Negative for  congestion, sore throat and tinnitus.   Eyes:  Negative for double vision, photophobia and pain.  Respiratory:  Negative for cough, shortness of breath and wheezing.   Cardiovascular:  Negative for chest pain, palpitations and orthopnea.  Gastrointestinal:  Negative for heartburn, nausea and vomiting.  Genitourinary:  Negative for dysuria, frequency and urgency.  Musculoskeletal:  Positive for joint pain.  Neurological:  Negative for dizziness, weakness and headaches.     Objective:  Physical Exam: Well nourished and well developed.  General: Alert and oriented x3, cooperative and pleasant, no acute distress.  Head: normocephalic, atraumatic, neck supple.  Eyes: EOMI.  Musculoskeletal:  Left hip flexion to 100 degrees, minimal internal rotation approximately 20 degrees, external rotation 20 degrees, abduction limited.  Calves soft and nontender. Motor function intact in LE. Strength 5/5 LE bilaterally. Neuro: Distal pulses 2+. Sensation to light touch intact in LE.  Imaging Review Plain radiographs demonstrate severe degenerative joint disease of the left hip. The bone quality appears to be adequate for age and reported activity level.  Assessment/Plan:  End stage arthritis, left hip  The patient history, physical examination, clinical judgement of the provider and imaging studies are consistent with end stage degenerative joint disease of the left hip and total hip arthroplasty is deemed medically necessary. The treatment options including medical management, injection therapy, arthroscopy and arthroplasty were discussed at length. The risks and benefits of total hip arthroplasty were presented and reviewed. The risks due to aseptic loosening, infection, stiffness, dislocation/subluxation, thromboembolic complications and other imponderables were discussed. The patient acknowledged the explanation, agreed to proceed with the plan and consent was signed. Patient is being admitted for  inpatient treatment for surgery, pain control, PT, OT, prophylactic antibiotics, VTE prophylaxis, progressive ambulation and ADLs and discharge planning.The patient is planning to be discharged  home .   Patient's anticipated LOS is less than 2 midnights, meeting these requirements: - Lives within 1 hour of care - Has a competent adult at home to recover with post-op recover - NO history of  - Chronic pain requiring opioids  - Diabetes  - Coronary Artery Disease  - Heart failure  - Heart attack  - Stroke  - DVT/VTE  - Cardiac arrhythmia  - Respiratory Failure/COPD  - Renal failure  - Anemia  - Advanced Liver disease  Therapy Plans: HEP Disposition: Home with family Planned DVT Prophylaxis: Aspirin 81 mg BID DME Needed: None PCP: Blair Heys, MD (clearance received) TXA: IV Allergies: Gabapentin (nausea), tramadol (itching), sulfa Anesthesia Concerns: None BMI: 32.5 Last HgbA1c: Not diabetic Pain Regimen: Hydrocodone Pharmacy: [facility] (bring to room)  - Patient was instructed on what medications to stop prior to surgery. - Follow-up visit in 2 weeks with Dr. Lequita Halt - Begin physical therapy following surgery - Pre-operative lab work as pre-surgical testing - Prescriptions  will be provided in hospital at time of discharge  Arther Abbott, PA-C Orthopedic Surgery [facility] [place]

## 2023-08-07 ENCOUNTER — Encounter: Payer: Self-pay | Admitting: Internal Medicine

## 2023-08-10 NOTE — Patient Instructions (Signed)
SURGICAL WAITING ROOM VISITATION  Patients having surgery or a procedure may have no more than 2 support people in the waiting area - these visitors may rotate.    Children under the age of 28 must have an adult with them who is not the patient.  Due to an increase in RSV and influenza rates and associated hospitalizations, children ages 7 and under may not visit patients in [facility] hospitals.  If the patient needs to stay at the hospital during part of their recovery, the visitor guidelines for inpatient rooms apply. Pre-op nurse will coordinate an appropriate time for 1 support person to accompany patient in pre-op.  This support person may not rotate.    Please refer to the [facility] website for the visitor guidelines for Inpatients (after your surgery is over and you are in a regular room).       Your procedure is scheduled on: 08/22/23   Report to [facility] Main Entrance    Report to admitting at  10 AM   Call this number if you have problems the morning of surgery [phone]   Do not eat food :After Midnight.   After Midnight you may have the following liquids until 9:25 AM DAY OF SURGERY  Water Non-Citrus Juices (without pulp, NO RED-Apple, White grape, White cranberry) Black Coffee (NO MILK/CREAM OR CREAMERS, sugar ok)  Clear Tea (NO MILK/CREAM OR CREAMERS, sugar ok) regular and decaf                             Plain Jell-O (NO RED)                                           Fruit ices (not with fruit pulp, NO RED)                                     Popsicles (NO RED)                                                               Sports drinks like Gatorade (NO RED)                The day of surgery:  Drink ONE (1) Pre-Surgery Clear Ensure  at 9:25  AM the morning of surgery. Drink in one sitting. Do not sip.  This drink was given to you during your hospital  pre-op appointment visit. Nothing else to drink after completing the  Pre-Surgery Clear  Ensure        Oral Hygiene is also important to reduce your risk of infection.                                    Remember - BRUSH YOUR TEETH THE MORNING OF SURGERY WITH YOUR REGULAR TOOTHPASTE  DENTURES WILL BE REMOVED PRIOR TO SURGERY PLEASE DO NOT APPLY "Poly grip" OR ADHESIVES!!!      Stop all vitamins and herbal supplements 7 days before  surgery.   Take these medicines the morning of surgery with A SIP OF WATER: Amlodipine, Fenofibrate, tapazole, Tylenol if needed.             You may not have any metal on your body including hair pins, jewelry, and body piercing             Do not wear make-up, lotions, powders, perfumes/cologne, or deodorant  Do not wear nail polish including gel and S&S, artificial/acrylic nails, or any other type of covering on natural nails including finger and toenails. If you have artificial nails, gel coating, etc. that needs to be removed by a nail salon please have this removed prior to surgery or surgery may need to be canceled/ delayed if the surgeon/ anesthesia feels like they are unable to be safely monitored.   Do not shave  48 hours prior to surgery.    Do not bring valuables to the hospital. [facility] IS NOT             RESPONSIBLE   FOR VALUABLES.   Contacts, glasses, dentures or bridgework may not be worn into surgery.   Bring small overnight bag day of surgery.   DO NOT BRING YOUR HOME MEDICATIONS TO THE HOSPITAL. PHARMACY WILL DISPENSE MEDICATIONS LISTED ON YOUR MEDICATION LIST TO YOU DURING YOUR ADMISSION IN THE HOSPITAL!    Patients discharged on the day of surgery will not be allowed to drive home.  Someone NEEDS to stay with you for the first 24 hours after anesthesia.   Special Instructions: Bring a copy of your healthcare power of attorney and living will documents the day of surgery if you haven't scanned them before.              Please read over the following fact sheets you were given: IF YOU HAVE QUESTIONS ABOUT YOUR PRE-OP  INSTRUCTIONS PLEASE CALL [phone] [facility]   If you received a COVID test during your pre-op visit  it is requested that you wear a mask when out in public, stay away from anyone that may not be feeling well and notify your surgeon if you develop symptoms. If you test positive for Covid or have been in contact with anyone that has tested positive in the last 10 days please notify you surgeon.      Pre-operative 5 CHG Bath Instructions   You can play a key role in reducing the risk of infection after surgery. Your skin needs to be as free of germs as possible. You can reduce the number of germs on your skin by washing with CHG (chlorhexidine gluconate) soap before surgery. CHG is an antiseptic soap that kills germs and continues to kill germs even after washing.   DO NOT use if you have an allergy to chlorhexidine/CHG or antibacterial soaps. If your skin becomes reddened or irritated, stop using the CHG and notify one of our RNs at [phone].   Please shower with the CHG soap starting 4 days before surgery using the following schedule:     Please keep in mind the following:  DO NOT shave, including legs and underarms, starting the day of your first shower.   You may shave your face at any point before/day of surgery.  Place clean sheets on your bed the day you start using CHG soap. Use a clean washcloth (not used since being washed) for each shower. DO NOT sleep with pets once you start using the CHG.   CHG Shower Instructions:  If  you choose to wash your hair and private area, wash first with your normal shampoo/soap.  After you use shampoo/soap, rinse your hair and body thoroughly to remove shampoo/soap residue.  Turn the water OFF and apply about 3 tablespoons (45 ml) of CHG soap to a CLEAN washcloth.  Apply CHG soap ONLY FROM YOUR NECK DOWN TO YOUR TOES (washing for 3-5 minutes)  DO NOT use CHG soap on face, private areas, open wounds, or sores.  Pay special attention to the  area where your surgery is being performed.  If you are having back surgery, having someone wash your back for you may be helpful. Wait 2 minutes after CHG soap is applied, then you may rinse off the CHG soap.  Pat dry with a clean towel  Put on clean clothes/pajamas   If you choose to wear lotion, please use ONLY the CHG-compatible lotions on the back of this paper.     Additional instructions for the day of surgery: DO NOT APPLY any lotions, deodorants, cologne, or perfumes.   Put on clean/comfortable clothes.  Brush your teeth.  Ask your nurse before applying any prescription medications to the skin.      CHG Compatible Lotions   Aveeno Moisturizing lotion  Cetaphil Moisturizing Cream  Cetaphil Moisturizing Lotion  Clairol Herbal Essence Moisturizing Lotion, Dry Skin  Clairol Herbal Essence Moisturizing Lotion, Extra Dry Skin  Clairol Herbal Essence Moisturizing Lotion, Normal Skin  Curel Age Defying Therapeutic Moisturizing Lotion with Alpha Hydroxy  Curel Extreme Care Body Lotion  Curel Soothing Hands Moisturizing Hand Lotion  Curel Therapeutic Moisturizing Cream, Fragrance-Free  Curel Therapeutic Moisturizing Lotion, Fragrance-Free  Curel Therapeutic Moisturizing Lotion, Original Formula  Eucerin Daily Replenishing Lotion  Eucerin Dry Skin Therapy Plus Alpha Hydroxy Crme  Eucerin Dry Skin Therapy Plus Alpha Hydroxy Lotion  Eucerin Original Crme  Eucerin Original Lotion  Eucerin Plus Crme Eucerin Plus Lotion  Eucerin TriLipid Replenishing Lotion  Keri Anti-Bacterial Hand Lotion  Keri Deep Conditioning Original Lotion Dry Skin Formula Softly Scented  Keri Deep Conditioning Original Lotion, Fragrance Free Sensitive Skin Formula  Keri Lotion Fast Absorbing Fragrance Free Sensitive Skin Formula  Keri Lotion Fast Absorbing Softly Scented Dry Skin Formula  Keri Original Lotion  Keri Skin Renewal Lotion Keri Silky Smooth Lotion  Keri Silky Smooth Sensitive Skin Lotion   Nivea Body Creamy Conditioning Oil  Nivea Body Extra Enriched Lotion  Nivea Body Original Lotion  Nivea Body Sheer Moisturizing Lotion Nivea Crme  Nivea Skin Firming Lotion  NutraDerm 30 Skin Lotion  NutraDerm Skin Lotion  NutraDerm Therapeutic Skin Cream  NutraDerm Therapeutic Skin Lotion  ProShield Protective Hand Cream  Incentive Spirometer (Watch this video at home: [email or web address])  An incentive spirometer is a tool that can help keep your lungs clear and active. This tool measures how well you are filling your lungs with each breath. Taking long deep breaths may help reverse or decrease the chance of developing breathing (pulmonary) problems (especially infection) following: A long period of time when you are unable to move or be active. BEFORE THE PROCEDURE  If the spirometer includes an indicator to show your best effort, your nurse or respiratory therapist will set it to a desired goal. If possible, sit up straight or lean slightly forward. Try not to slouch. Hold the incentive spirometer in an upright position. INSTRUCTIONS FOR USE  Sit on the edge of your bed if possible, or sit up as far as you can in bed or on a chair. Hold  the incentive spirometer in an upright position. Breathe out normally. Place the mouthpiece in your mouth and seal your lips tightly around it. Breathe in slowly and as deeply as possible, raising the piston or the ball toward the top of the column. Hold your breath for 3-5 seconds or for as long as possible. Allow the piston or ball to fall to the bottom of the column. Remove the mouthpiece from your mouth and breathe out normally. Rest for a few seconds and repeat Steps 1 through 7 at least 10 times every 1-2 hours when you are awake. Take your time and take a few normal breaths between deep breaths. The spirometer may include an indicator to show your best effort. Use the indicator as a goal to work toward during each  repetition. After each set of 10 deep breaths, practice coughing to be sure your lungs are clear. If you have an incision (the cut made at the time of surgery), support your incision when coughing by placing a pillow or rolled up towels firmly against it. Once you are able to get out of bed, walk around indoors and cough well. You may stop using the incentive spirometer when instructed by your caregiver.  RISKS AND COMPLICATIONS Take your time so you do not get dizzy or light-headed. If you are in pain, you may need to take or ask for pain medication before doing incentive spirometry. It is harder to take a deep breath if you are having pain. AFTER USE Rest and breathe slowly and easily. It can be helpful to keep track of a log of your progress. Your caregiver can provide you with a simple table to help with this. If you are using the spirometer at home, follow these instructions: SEEK MEDICAL CARE IF:  You are having difficultly using the spirometer. You have trouble using the spirometer as often as instructed. Your pain medication is not giving enough relief while using the spirometer. You develop fever of 100.5 F (38.1 C) or higher. SEEK IMMEDIATE MEDICAL CARE IF:  You cough up bloody sputum that had not been present before. You develop fever of 102 F (38.9 C) or greater. You develop worsening pain at or near the incision site. MAKE SURE YOU:  Understand these instructions. Will watch your condition. Will get help right away if you are not doing well or get worse. Document Released: 01/29/2007 Document Revised: 12/11/2011 Document Reviewed: 04/01/2007 [facility] Patient Information 2014 [place], [place].

## 2023-08-10 NOTE — Progress Notes (Addendum)
COVID Vaccine received:  [x]  No []  Yes Date of any COVID positive Test in last 90 days: no PCP - Dr. Irven Coe Cardiologist - no  Chest x-ray -  EKG -  08/13/23 Epic Stress Test -  ECHO -  Cardiac Cath -   Cardiac and medical clearance Dr.Ehinger7/30/24  Bowel Prep - [x]  No  []   Yes ______  Pacemaker / ICD device [x]  No []  Yes   Spinal Cord Stimulator:[x]  No []  Yes       History of Sleep Apnea? [x]  No []  Yes   CPAP used?- [x]  No []  Yes    Does the patient monitor blood sugar?          [x]  No []  Yes  []  N/A  Patient has: [x]  NO Hx DM   []  Pre-DM                 []  DM1  []   DM2 Does patient have a [employer] or Dexacom? []  No []  Yes   Fasting Blood Sugar Ranges-  Checks Blood Sugar _____ times a day  GLP1 agonist / usual dose - no GLP1 instructions:  SGLT-2 inhibitors / usual dose - no SGLT-2 instructions:   Blood Thinner / Instructions:no Aspirin Instructions:no  Comments:   Activity level: Patient is able to climb a flight of stairs without difficulty; [x]  No CP  [x]  No SOB, ___   Patient can perform ADLs without assistance.   Anesthesia review: HTN, Abnl. EKG-L fascicular block.  Patient denies shortness of breath, fever, cough and chest pain at PAT appointment.  Patient verbalized understanding and agreement to the Pre-Surgical Instructions that were given to them at this PAT appointment. Patient was also educated of the need to review these PAT instructions again prior to his/her surgery.I reviewed the appropriate phone numbers to call if they have any and questions or concerns.

## 2023-08-13 ENCOUNTER — Encounter (HOSPITAL_COMMUNITY): Payer: Self-pay

## 2023-08-13 ENCOUNTER — Other Ambulatory Visit: Payer: Self-pay

## 2023-08-13 ENCOUNTER — Encounter (HOSPITAL_COMMUNITY)
Admission: RE | Admit: 2023-08-13 | Discharge: 2023-08-13 | Disposition: A | Discharge status: Home / Self Care | Payer: Medicare HMO | Source: Ambulatory Visit | Attending: Orthopedic Surgery

## 2023-08-13 VITALS — BP 169/86 | HR 98 | Temp 98.3°F | Resp 16 | Ht 59.0 in | Wt 159.0 lb

## 2023-08-13 DIAGNOSIS — I444 Left anterior fascicular block: Secondary | ICD-10-CM | POA: Insufficient documentation

## 2023-08-13 DIAGNOSIS — Z01818 Encounter for other preprocedural examination: Secondary | ICD-10-CM | POA: Insufficient documentation

## 2023-08-13 DIAGNOSIS — I10 Essential (primary) hypertension: Secondary | ICD-10-CM | POA: Diagnosis not present

## 2023-08-13 HISTORY — DX: Unspecified osteoarthritis, unspecified site: M19.90

## 2023-08-13 HISTORY — DX: Thyrotoxicosis, unspecified without thyrotoxic crisis or storm: E05.90

## 2023-08-13 LAB — SURGICAL PCR SCREEN
MRSA, PCR: NEGATIVE
Staphylococcus aureus: POSITIVE — AB

## 2023-08-13 LAB — CBC
HCT: 44.6 % (ref 36.0–46.0)
Hemoglobin: 14.4 g/dL (ref 12.0–15.0)
MCH: 29.3 pg (ref 26.0–34.0)
MCHC: 32.3 g/dL (ref 30.0–36.0)
MCV: 90.8 fL (ref 80.0–100.0)
Platelets: 429 10*3/uL — ABNORMAL HIGH (ref 150–400)
RBC: 4.91 MIL/uL (ref 3.87–5.11)
RDW: 12.8 % (ref 11.5–15.5)
WBC: 5.8 10*3/uL (ref 4.0–10.5)
nRBC: 0 % (ref 0.0–0.2)

## 2023-08-13 LAB — BASIC METABOLIC PANEL
Anion gap: 10 (ref 5–15)
BUN: 12 mg/dL (ref 8–23)
CO2: 25 mmol/L (ref 22–32)
Calcium: 9.5 mg/dL (ref 8.9–10.3)
Chloride: 104 mmol/L (ref 98–111)
Creatinine, Ser: 0.61 mg/dL (ref 0.44–1.00)
GFR, Estimated: >60 mL/min (ref >60)
Glucose, Bld: 148 mg/dL — ABNORMAL HIGH (ref 70–99)
Potassium: 4 mmol/L (ref 3.5–5.1)
Sodium: 139 mmol/L (ref 135–145)

## 2023-08-13 NOTE — Progress Notes (Signed)
Please review Preop PCR result from 08/13/23.

## 2023-08-22 ENCOUNTER — Ambulatory Visit (HOSPITAL_COMMUNITY): Payer: Medicare HMO | Admitting: Medical

## 2023-08-22 ENCOUNTER — Ambulatory Visit (HOSPITAL_COMMUNITY): Payer: Medicare HMO

## 2023-08-22 ENCOUNTER — Encounter (HOSPITAL_COMMUNITY)
Admission: RE | Disposition: A | Discharge status: Home / Self Care | Payer: Self-pay | Source: Ambulatory Visit | Attending: Orthopedic Surgery

## 2023-08-22 ENCOUNTER — Observation Stay (HOSPITAL_COMMUNITY): Payer: Medicare HMO

## 2023-08-22 ENCOUNTER — Ambulatory Visit (HOSPITAL_BASED_OUTPATIENT_CLINIC_OR_DEPARTMENT_OTHER): Payer: Medicare HMO | Admitting: Anesthesiology

## 2023-08-22 ENCOUNTER — Observation Stay (HOSPITAL_COMMUNITY)
Admission: RE | Admit: 2023-08-22 | Discharge: 2023-08-23 | Disposition: A | Discharge status: Home / Self Care | Payer: Medicare HMO | Source: Ambulatory Visit | Attending: Orthopedic Surgery | Admitting: Orthopedic Surgery

## 2023-08-22 ENCOUNTER — Encounter (HOSPITAL_COMMUNITY): Payer: Self-pay | Admitting: Orthopedic Surgery

## 2023-08-22 ENCOUNTER — Other Ambulatory Visit: Payer: Self-pay

## 2023-08-22 DIAGNOSIS — Z79899 Other long term (current) drug therapy: Secondary | ICD-10-CM | POA: Diagnosis not present

## 2023-08-22 DIAGNOSIS — Z87891 Personal history of nicotine dependence: Secondary | ICD-10-CM | POA: Insufficient documentation

## 2023-08-22 DIAGNOSIS — I10 Essential (primary) hypertension: Secondary | ICD-10-CM | POA: Diagnosis not present

## 2023-08-22 DIAGNOSIS — Z96643 Presence of artificial hip joint, bilateral: Secondary | ICD-10-CM | POA: Diagnosis not present

## 2023-08-22 DIAGNOSIS — M1612 Unilateral primary osteoarthritis, left hip: Principal | ICD-10-CM | POA: Diagnosis present

## 2023-08-22 DIAGNOSIS — M169 Osteoarthritis of hip, unspecified: Principal | ICD-10-CM | POA: Diagnosis present

## 2023-08-22 HISTORY — PX: TOTAL HIP ARTHROPLASTY: SHX124

## 2023-08-22 LAB — TYPE AND SCREEN
ABO/RH(D): A POS
Antibody Screen: NEGATIVE

## 2023-08-22 LAB — ABO/RH: ABO/RH(D): A POS

## 2023-08-22 SURGERY — ARTHROPLASTY, HIP, TOTAL, ANTERIOR APPROACH
Anesthesia: Spinal | Site: Hip | Laterality: Left

## 2023-08-22 MED ORDER — DIPHENHYDRAMINE HCL 12.5 MG/5ML PO ELIX: 12.5000 mg | ORAL_SOLUTION | ORAL | Status: DC | PRN

## 2023-08-22 MED ORDER — PROPOFOL 10 MG/ML IV BOLUS
INTRAVENOUS | Status: DC | PRN
  Administered 2023-08-22 (×2): 10 mg via INTRAVENOUS

## 2023-08-22 MED ORDER — DOCUSATE SODIUM 100 MG PO CAPS
100.0000 mg | ORAL_CAPSULE | Freq: Two times a day (BID) | ORAL | Status: DC
  Administered 2023-08-22 – 2023-08-23 (×2): 100 mg via ORAL
  Filled 2023-08-22 (×2): qty 1

## 2023-08-22 MED ORDER — CHLORHEXIDINE GLUCONATE 0.12 % MT SOLN
15.0000 mL | Freq: Once | OROMUCOSAL | Status: AC
Start: 2023-08-22 — End: 2023-08-22
  Administered 2023-08-22: 15 mL via OROMUCOSAL

## 2023-08-22 MED ORDER — BUPIVACAINE HCL (PF) 0.25 % IJ SOLN
INTRAMUSCULAR | Status: AC
Start: 2023-08-22 — End: ?
  Filled 2023-08-22: qty 30

## 2023-08-22 MED ORDER — FENOFIBRATE 160 MG PO TABS
160.0000 mg | ORAL_TABLET | Freq: Every day | ORAL | Status: DC
  Administered 2023-08-23: 160 mg via ORAL
  Filled 2023-08-22: qty 1

## 2023-08-22 MED ORDER — BUPIVACAINE HCL (PF) 0.25 % IJ SOLN
INTRAMUSCULAR | Status: DC | PRN
  Administered 2023-08-22: 30 mL

## 2023-08-22 MED ORDER — ONDANSETRON HCL 4 MG/2ML IJ SOLN: 4.0000 mg | Freq: Four times a day (QID) | INTRAMUSCULAR | Status: DC | PRN

## 2023-08-22 MED ORDER — ONDANSETRON HCL 4 MG PO TABS: 4.0000 mg | ORAL_TABLET | Freq: Four times a day (QID) | ORAL | Status: DC | PRN

## 2023-08-22 MED ORDER — ACETAMINOPHEN 10 MG/ML IV SOLN
1000.0000 mg | Freq: Four times a day (QID) | INTRAVENOUS | Status: DC
  Administered 2023-08-22: 1000 mg via INTRAVENOUS
  Filled 2023-08-22: qty 100

## 2023-08-22 MED ORDER — SODIUM CHLORIDE 0.9 % IV SOLN: INTRAVENOUS | Status: DC

## 2023-08-22 MED ORDER — PROPOFOL 500 MG/50ML IV EMUL
INTRAVENOUS | Status: DC | PRN
  Administered 2023-08-22: 100 ug/kg/min via INTRAVENOUS

## 2023-08-22 MED ORDER — PROPOFOL 1000 MG/100ML IV EMUL
INTRAVENOUS | Status: AC
  Filled 2023-08-22: qty 100

## 2023-08-22 MED ORDER — ASPIRIN 81 MG PO CHEW
81.0000 mg | CHEWABLE_TABLET | Freq: Two times a day (BID) | ORAL | Status: DC
Start: 2023-08-23 — End: 2023-08-23
  Administered 2023-08-23: 81 mg via ORAL
  Filled 2023-08-22: qty 1

## 2023-08-22 MED ORDER — OXYCODONE HCL 5 MG/5ML PO SOLN: 5.0000 mg | Freq: Once | ORAL | Status: DC | PRN

## 2023-08-22 MED ORDER — OXYCODONE HCL 5 MG PO TABS: 5.0000 mg | ORAL_TABLET | Freq: Once | ORAL | Status: DC | PRN

## 2023-08-22 MED ORDER — DEXAMETHASONE SODIUM PHOSPHATE 10 MG/ML IJ SOLN
8.0000 mg | Freq: Once | INTRAMUSCULAR | Status: AC
  Administered 2023-08-22: 10 mg via INTRAVENOUS

## 2023-08-22 MED ORDER — METOCLOPRAMIDE HCL 5 MG PO TABS: 5.0000 mg | ORAL_TABLET | Freq: Three times a day (TID) | ORAL | Status: DC | PRN

## 2023-08-22 MED ORDER — ACETAMINOPHEN 325 MG PO TABS
325.0000 mg | ORAL_TABLET | Freq: Four times a day (QID) | ORAL | Status: DC | PRN
  Administered 2023-08-22: 650 mg via ORAL
  Filled 2023-08-22: qty 2

## 2023-08-22 MED ORDER — PHENOL 1.4 % MT LIQD: 1.0000 | OROMUCOSAL | Status: DC | PRN

## 2023-08-22 MED ORDER — DEXAMETHASONE SODIUM PHOSPHATE 10 MG/ML IJ SOLN
10.0000 mg | Freq: Once | INTRAMUSCULAR | Status: AC
  Administered 2023-08-23: 10 mg via INTRAVENOUS
  Filled 2023-08-22: qty 1

## 2023-08-22 MED ORDER — ORAL CARE MOUTH RINSE: 15.0000 mL | Freq: Once | OROMUCOSAL | Status: AC

## 2023-08-22 MED ORDER — CEFAZOLIN SODIUM-DEXTROSE 2-4 GM/100ML-% IV SOLN
2.0000 g | Freq: Four times a day (QID) | INTRAVENOUS | Status: AC
  Administered 2023-08-22 – 2023-08-23 (×2): 2 g via INTRAVENOUS
  Filled 2023-08-22 (×2): qty 100

## 2023-08-22 MED ORDER — FENTANYL CITRATE PF 50 MCG/ML IJ SOSY: 25.0000 ug | PREFILLED_SYRINGE | INTRAMUSCULAR | Status: DC | PRN

## 2023-08-22 MED ORDER — HYDROCODONE-ACETAMINOPHEN 7.5-325 MG PO TABS
1.0000 | ORAL_TABLET | ORAL | Status: DC | PRN
  Administered 2023-08-23: 1 via ORAL
  Filled 2023-08-22: qty 1

## 2023-08-22 MED ORDER — ONDANSETRON HCL 4 MG/2ML IJ SOLN
INTRAMUSCULAR | Status: DC | PRN
  Administered 2023-08-22: 4 mg via INTRAVENOUS

## 2023-08-22 MED ORDER — AMLODIPINE BESYLATE 10 MG PO TABS
10.0000 mg | ORAL_TABLET | Freq: Every day | ORAL | Status: DC
  Administered 2023-08-23: 10 mg via ORAL
  Filled 2023-08-22: qty 1

## 2023-08-22 MED ORDER — DEXAMETHASONE SODIUM PHOSPHATE 10 MG/ML IJ SOLN
INTRAMUSCULAR | Status: AC
  Filled 2023-08-22: qty 1

## 2023-08-22 MED ORDER — FLEET ENEMA RE ENEM: 1.0000 | ENEMA | Freq: Once | RECTAL | Status: DC | PRN

## 2023-08-22 MED ORDER — METOCLOPRAMIDE HCL 5 MG/ML IJ SOLN: 5.0000 mg | Freq: Three times a day (TID) | INTRAMUSCULAR | Status: DC | PRN

## 2023-08-22 MED ORDER — POLYETHYLENE GLYCOL 3350 17 G PO PACK: 17.0000 g | PACK | Freq: Every day | ORAL | Status: DC | PRN

## 2023-08-22 MED ORDER — HYDROMORPHONE HCL 1 MG/ML IJ SOLN
0.5000 mg | INTRAMUSCULAR | Status: DC | PRN
Start: 2023-08-22 — End: 2023-08-23

## 2023-08-22 MED ORDER — METHOCARBAMOL 1000 MG/10ML IJ SOLN: 500.0000 mg | Freq: Four times a day (QID) | INTRAMUSCULAR | Status: DC | PRN

## 2023-08-22 MED ORDER — HYDROCODONE-ACETAMINOPHEN 5-325 MG PO TABS
1.0000 | ORAL_TABLET | ORAL | Status: DC | PRN
  Administered 2023-08-22 – 2023-08-23 (×2): 1 via ORAL
  Filled 2023-08-22 (×2): qty 1

## 2023-08-22 MED ORDER — MENTHOL 3 MG MT LOZG: 1.0000 | LOZENGE | OROMUCOSAL | Status: DC | PRN

## 2023-08-22 MED ORDER — ACETAMINOPHEN 500 MG PO TABS: 1000.0000 mg | ORAL_TABLET | Freq: Once | ORAL | Status: DC

## 2023-08-22 MED ORDER — BISACODYL 10 MG RE SUPP: 10.0000 mg | Freq: Every day | RECTAL | Status: DC | PRN

## 2023-08-22 MED ORDER — MIDAZOLAM HCL 2 MG/2ML IJ SOLN: 0.5000 mg | Freq: Once | INTRAMUSCULAR | Status: DC | PRN

## 2023-08-22 MED ORDER — PHENYLEPHRINE HCL-NACL 20-0.9 MG/250ML-% IV SOLN
INTRAVENOUS | Status: DC | PRN
  Administered 2023-08-22: 35 ug/min via INTRAVENOUS

## 2023-08-22 MED ORDER — 0.9 % SODIUM CHLORIDE (POUR BTL) OPTIME
TOPICAL | Status: DC | PRN
  Administered 2023-08-22: 1000 mL

## 2023-08-22 MED ORDER — METHIMAZOLE 5 MG PO TABS
5.0000 mg | ORAL_TABLET | ORAL | Status: DC
  Administered 2023-08-23: 5 mg via ORAL
  Filled 2023-08-22: qty 1

## 2023-08-22 MED ORDER — METHIMAZOLE 2.5 MG HALF TABLET: 2.5000 mg | ORAL_TABLET | ORAL | Status: DC

## 2023-08-22 MED ORDER — BUPIVACAINE IN DEXTROSE 0.75-8.25 % IT SOLN
INTRATHECAL | Status: DC | PRN
  Administered 2023-08-22: 10.5 mg via INTRATHECAL

## 2023-08-22 MED ORDER — ONDANSETRON HCL 4 MG/2ML IJ SOLN
INTRAMUSCULAR | Status: AC
  Filled 2023-08-22: qty 2

## 2023-08-22 MED ORDER — TRANEXAMIC ACID-NACL 1000-0.7 MG/100ML-% IV SOLN
1000.0000 mg | INTRAVENOUS | Status: AC
  Administered 2023-08-22: 1000 mg via INTRAVENOUS
  Filled 2023-08-22: qty 100

## 2023-08-22 MED ORDER — CEFAZOLIN SODIUM-DEXTROSE 2-4 GM/100ML-% IV SOLN
2.0000 g | INTRAVENOUS | Status: AC
  Administered 2023-08-22: 2 g via INTRAVENOUS
  Filled 2023-08-22: qty 100

## 2023-08-22 MED ORDER — LACTATED RINGERS IV SOLN: INTRAVENOUS | Status: DC

## 2023-08-22 MED ORDER — METHOCARBAMOL 500 MG PO TABS
500.0000 mg | ORAL_TABLET | Freq: Four times a day (QID) | ORAL | Status: DC | PRN
  Administered 2023-08-22 – 2023-08-23 (×2): 500 mg via ORAL
  Filled 2023-08-22 (×3): qty 1

## 2023-08-22 MED ORDER — POVIDONE-IODINE 10 % EX SWAB: 2.0000 | Freq: Once | CUTANEOUS | Status: DC

## 2023-08-22 SURGICAL SUPPLY — 37 items
BAG COUNTER SPONGE SURGICOUNT (BAG) IMPLANT
BAG ZIPLOCK 12X15 (MISCELLANEOUS) IMPLANT
BLADE SAG 18X100X1.27 (BLADE) ×2 IMPLANT
COVER PERINEAL POST (MISCELLANEOUS) ×2 IMPLANT
COVER SURGICAL LIGHT HANDLE (MISCELLANEOUS) ×2 IMPLANT
CUP ACET PINNACLE SECTR 50MM (Hips) IMPLANT
DERMABOND ADVANCED .7 DNX12 (GAUZE/BANDAGES/DRESSINGS) ×2 IMPLANT
DRAPE FOOT SWITCH (DRAPES) ×2 IMPLANT
DRAPE STERI IOBAN 125X83 (DRAPES) ×2 IMPLANT
DRAPE U-SHAPE 47X51 STRL (DRAPES) ×4 IMPLANT
DRSG AQUACEL AG ADV 3.5X10 (GAUZE/BANDAGES/DRESSINGS) ×2 IMPLANT
DURAPREP 26ML APPLICATOR (WOUND CARE) ×2 IMPLANT
ELECT REM PT RETURN 15FT ADLT (MISCELLANEOUS) ×2 IMPLANT
GLOVE BIO SURGEON STRL SZ 6.5 (GLOVE) IMPLANT
GLOVE BIO SURGEON STRL SZ8 (GLOVE) ×2 IMPLANT
GLOVE BIOGEL PI IND STRL 6.5 (GLOVE) IMPLANT
GLOVE BIOGEL PI IND STRL 7.0 (GLOVE) IMPLANT
GLOVE BIOGEL PI IND STRL 8 (GLOVE) ×2 IMPLANT
GOWN STRL REUS W/ TWL LRG LVL3 (GOWN DISPOSABLE) ×2 IMPLANT
HEAD FEM STD 32X+1 STRL (Hips) IMPLANT
HOLDER FOLEY CATH W/STRAP (MISCELLANEOUS) ×2 IMPLANT
KIT TURNOVER KIT A (KITS) IMPLANT
LINER MARATHON 32 50 (Hips) IMPLANT
MANIFOLD NEPTUNE II (INSTRUMENTS) ×2 IMPLANT
PACK ANTERIOR HIP CUSTOM (KITS) ×2 IMPLANT
PENCIL SMOKE EVACUATOR COATED (MISCELLANEOUS) ×2 IMPLANT
PINNACLE SECTOR CUP 50MM (Hips) ×1 IMPLANT
SPIKE FLUID TRANSFER (MISCELLANEOUS) ×2 IMPLANT
STEM FEM ACTIS STD SZ2 (Stem) IMPLANT
SUT ETHIBOND NAB CT1 #1 30IN (SUTURE) ×2 IMPLANT
SUT MNCRL AB 4-0 PS2 18 (SUTURE) ×2 IMPLANT
SUT STRATAFIX 0 PDS 27 VIOLET (SUTURE) ×1
SUT VIC AB 2-0 CT1 TAPERPNT 27 (SUTURE) ×4 IMPLANT
SUTURE STRATFX 0 PDS 27 VIOLET (SUTURE) ×2 IMPLANT
TRAY FOLEY MTR SLVR 16FR STAT (SET/KITS/TRAYS/PACK) ×2 IMPLANT
TUBE SUCTION HIGH CAP CLEAR NV (SUCTIONS) ×2 IMPLANT
WATER STERILE IRR 500ML POUR (IV SOLUTION) IMPLANT

## 2023-08-22 NOTE — Evaluation (Addendum)
Physical Therapy Evaluation Patient Details Name: Suzanne Trevino MRN: [id number] DOB: 1946/07/07 Today's Date: 08/22/2023  History of Present Illness  77 yo female presents to therapy s/p L THA, anterior approach due to failure of conservative measures. Pt PMH includes but is not limited to: L thyroid nodule, hyperthyroidism, cholecystis, HTN, and multiple R hip arthroscopies with leg length discrepancy at baseline R < L.  Clinical Impression      Suzanne Trevino is a 77 y.o. female POD 0 s/p L THA. Patient reports mod I with mobility at baseline. Patient is now limited by functional impairments (see PT problem list below) and requires S for bed mobility and CGA and cues for transfers. Patient was able to ambulate 40 feet with RW and CGA level of assist. Patient instructed in exercise to facilitate ROM and circulation to manage edema. Patient will benefit from continued skilled PT interventions to address impairments and progress towards PLOF. Acute PT will follow to progress mobility and stair training in preparation for safe discharge home with family support and HEP.     If plan is discharge home, recommend the following: A little help with walking and/or transfers;A little help with bathing/dressing/bathroom;Assistance with cooking/housework;Assist for transportation;Help with stairs or ramp for entrance   Can travel by private vehicle        Equipment Recommendations None recommended by PT  Recommendations for Other Services       Functional Status Assessment Patient has had a recent decline in their functional status and demonstrates the ability to make significant improvements in function in a reasonable and predictable amount of time.     Precautions / Restrictions Precautions Precautions: Fall Restrictions Weight Bearing Restrictions: No      Mobility  Bed Mobility Overal bed mobility: Needs Assistance Bed Mobility: Supine to Sit     Supine to sit: Supervision, HOB  elevated, Used rails     General bed mobility comments: min cues    Transfers Overall transfer level: Needs assistance   Transfers: Sit to/from Stand Sit to Stand: Contact guard assist           General transfer comment: min cues    Ambulation/Gait Ambulation/Gait assistance: Contact guard assist Gait Distance (Feet): 40 Feet Assistive device: Rolling walker (2 wheels) Gait Pattern/deviations: Step-to pattern, Antalgic, Trunk flexed Gait velocity: decreased     General Gait Details: pt requesting to don shoes with R lift due to leg length discrepancy, some lateral sway noted and B UE support to offload L LE in stance phase. min cues for safety and sequencing  Stairs            Wheelchair Mobility     Tilt Bed    Modified Rankin (Stroke Patients Only)       Balance Overall balance assessment: Needs assistance Sitting-balance support: Feet supported Sitting balance-Leahy Scale: Good     Standing balance support: Bilateral upper extremity supported, During functional activity, Reliant on assistive device for balance Standing balance-Leahy Scale: Fair Standing balance comment: static standing no UE support                             Pertinent Vitals/Pain Pain Assessment Pain Assessment: 0-10 Pain Score: 4  Pain Location: L hip and LE Pain Descriptors / Indicators: Aching, Discomfort, Dull, Grimacing, Operative site guarding Pain Intervention(s): Limited activity within patient's tolerance, Monitored during session, Premedicated before session, Repositioned, Ice applied    Home Living Family/patient  expects to be discharged to:: Private residence Living Arrangements: Alone Available Help at Discharge: Family Type of Home: House (condo) Home Access: Stairs to enter Entrance Stairs-Rails: Doctor, general practice of Steps: 4   Home Layout: One level Home Equipment: Agricultural consultant (2 wheels);Cane - single point      Prior  Function Prior Level of Function : Independent/Modified Independent             Mobility Comments: SPC for mobility tasks, mod I for all ADLs, self care tasks and requires some assist with IADLs from daughter       Extremity/Trunk Assessment        Lower Extremity Assessment Lower Extremity Assessment: LLE deficits/detail LLE Deficits / Details: ankle DF/PF 5/5 LLE Sensation: WNL    Cervical / Trunk Assessment Cervical / Trunk Assessment: Normal  Communication   Communication Communication: No apparent difficulties  Cognition Arousal: Alert Behavior During Therapy: WFL for tasks assessed/performed Overall Cognitive Status: Within Functional Limits for tasks assessed                                          General Comments      Exercises Total Joint Exercises Ankle Circles/Pumps: AROM, Both, 15 reps   Assessment/Plan    PT Assessment Patient needs continued PT services  PT Problem List Decreased strength;Decreased range of motion;Decreased activity tolerance;Decreased balance;Decreased mobility;Decreased coordination;Pain       PT Treatment Interventions DME instruction;Gait training;Stair training;Functional mobility training;Therapeutic activities;Therapeutic exercise;Balance training;Neuromuscular re-education;Patient/family education;Modalities    PT Goals (Current goals can be found in the Care Plan section)  Acute Rehab PT Goals Patient Stated Goal: to be able to do all the activities I was before the L hip pain started, travel and go to softball games PT Goal Formulation: With patient Time For Goal Achievement: 09/12/23 Potential to Achieve Goals: Good    Frequency 7X/week     Co-evaluation               AM-PAC PT "6 Clicks" Mobility  Outcome Measure Help needed turning from your back to your side while in a flat bed without using bedrails?: None Help needed moving from lying on your back to sitting on the side of a flat  bed without using bedrails?: A Little Help needed moving to and from a bed to a chair (including a wheelchair)?: A Little Help needed standing up from a chair using your arms (e.g., wheelchair or bedside chair)?: A Little Help needed to walk in hospital room?: A Little Help needed climbing 3-5 steps with a railing? : A Lot 6 Click Score: 18    End of Session Equipment Utilized During Treatment: Gait belt Activity Tolerance: No increased pain;Patient tolerated treatment well Patient left: in chair;with call bell/phone within reach;with chair alarm set Nurse Communication: Mobility status PT Visit Diagnosis: Unsteadiness on feet (R26.81);Other abnormalities of gait and mobility (R26.89);Muscle weakness (generalized) (M62.81);Pain;Difficulty in walking, not elsewhere classified (R26.2) Pain - Right/Left: Left Pain - part of body: Hip;Leg    Time: 8119-1478 PT Time Calculation (min) (ACUTE ONLY): 27 min   Charges:   PT Evaluation $PT Eval Low Complexity: 1 Low PT Treatments $Gait Training: 8-22 mins PT General Charges $$ ACUTE PT VISIT: 1 Visit         Johnny Bridge, PT Acute Rehab   Jacqualyn Posey 08/22/2023, 6:49 PM

## 2023-08-22 NOTE — Interval H&P Note (Signed)
History and Physical Interval Note:  08/22/2023 11:00 AM  Suzanne Trevino  has presented today for surgery, with the diagnosis of left hip osteoarthritis.  The various methods of treatment have been discussed with the patient and family. After consideration of risks, benefits and other options for treatment, the patient has consented to  Procedure(s): TOTAL HIP ARTHROPLASTY ANTERIOR APPROACH (Left) as a surgical intervention.  The patient's history has been reviewed, patient examined, no change in status, stable for surgery.  I have reviewed the patient's chart and labs.  Questions were answered to the patient's satisfaction.     Homero Fellers Letina Luckett

## 2023-08-22 NOTE — Anesthesia Postprocedure Evaluation (Signed)
Anesthesia Post Note  Patient: ESPERANZA DOMINY  Procedure(s) Performed: TOTAL HIP ARTHROPLASTY ANTERIOR APPROACH (Left: Hip)     Patient location during evaluation: PACU Anesthesia Type: Spinal Level of consciousness: awake and alert, oriented and patient cooperative Pain management: pain level controlled Vital Signs Assessment: post-procedure vital signs reviewed and stable Respiratory status: spontaneous breathing, nonlabored ventilation and respiratory function stable Cardiovascular status: blood pressure returned to baseline and stable Postop Assessment: no apparent nausea or vomiting, spinal receding and adequate PO intake Anesthetic complications: no   No notable events documented.  Last Vitals:  Vitals:   08/22/23 1430 08/22/23 1445  BP: 105/62 (!) 118/54  Pulse: 63 61  Resp: 13 (!) 9  Temp:    SpO2: 95% 96%    Last Pain:  Vitals:   08/22/23 1445  TempSrc:   PainSc: 0-No pain                 Terren Jandreau,E. Daud Cayer

## 2023-08-22 NOTE — Plan of Care (Signed)
  Problem: Education: Goal: Knowledge of General Education information will improve Description: Including pain rating scale, medication(s)/side effects and non-pharmacologic comfort measures Outcome: Progressing   Problem: Activity: Goal: Risk for activity intolerance will decrease Outcome: Progressing   Problem: Pain Management: Goal: General experience of comfort will improve Outcome: Progressing

## 2023-08-22 NOTE — Anesthesia Preprocedure Evaluation (Addendum)
Anesthesia Evaluation  Patient identified by MRN, date of birth, ID band Patient awake    Reviewed: Allergy & Precautions, NPO status , Patient's Chart, lab work & pertinent test results  History of Anesthesia Complications Negative for: history of anesthetic complications  Airway Mallampati: I  TM Distance: >3 FB Neck ROM: Full    Dental  (+) Dental Advisory Given, Caps   Pulmonary former smoker   breath sounds clear to auscultation       Cardiovascular hypertension, Pt. on medications (-) angina  Rhythm:Regular Rate:Normal     Neuro/Psych negative neurological ROS     GI/Hepatic negative GI ROS, Neg liver ROS,,,  Endo/Other   Hyperthyroidism Class 3 obesityBMI 32.11  Renal/GU negative Renal ROS     Musculoskeletal  (+) Arthritis ,    Abdominal   Peds  Hematology negative hematology ROS (+) Hb 14.4, plt 429k   Anesthesia Other Findings   Reproductive/Obstetrics                             Anesthesia Physical Anesthesia Plan  ASA: 2  Anesthesia Plan: Spinal   Post-op Pain Management: Tylenol PO (pre-op)*   Induction:   PONV Risk Score and Plan: 2 and Treatment may vary due to age or medical condition  Airway Management Planned: Natural Airway and Simple Face Mask  Additional Equipment: None  Intra-op Plan:   Post-operative Plan:   Informed Consent: I have reviewed the patients History and Physical, chart, labs and discussed the procedure including the risks, benefits and alternatives for the proposed anesthesia with the patient or authorized representative who has indicated his/her understanding and acceptance.     Dental advisory given  Plan Discussed with: CRNA and Surgeon  Anesthesia Plan Comments:         Anesthesia Quick Evaluation

## 2023-08-22 NOTE — Anesthesia Procedure Notes (Signed)
Spinal  Patient location during procedure: OR End time: 08/22/2023 12:06 PM Reason for block: surgical anesthesia Staffing Performed: anesthesiologist  Anesthesiologist: Jairo Ben, MD Performed by: Jairo Ben, MD Authorized by: Jairo Ben, MD   Preanesthetic Checklist Completed: patient identified, IV checked, site marked, risks and benefits discussed, surgical consent, monitors and equipment checked, pre-op evaluation and timeout performed Spinal Block Patient position: sitting Prep: DuraPrep Patient monitoring: heart rate, cardiac monitor, continuous pulse ox and blood pressure Approach: midline Location: L3-4 Injection technique: single-shot Needle Needle type: Pencan and Introducer  Needle gauge: 24 G Needle length: 9 cm Assessment Sensory level: T4 Events: CSF return Additional Notes Pt identified in Operating room.  Monitors applied. Working IV access confirmed. Timeout, Sterile prep, drape lumbar spine.  1% lido local L 3,4.  #24ga Pencan into clear CSF L 3,4.  10.5mg  0.75% Bupivacaine with dextrose injected with asp CSF beginning and end of injection.  Patient asymptomatic, VSS, no heme aspirated, tolerated well.  Sandford Craze, MD

## 2023-08-22 NOTE — Anesthesia Procedure Notes (Signed)
Procedure Name: MAC Date/Time: 08/22/2023 11:58 AM  Performed by: Orest Dikes, CRNAPre-anesthesia Checklist: Patient identified, Emergency Drugs available, Suction available and Patient being monitored Oxygen Delivery Method: Simple face mask

## 2023-08-22 NOTE — Op Note (Signed)
OPERATIVE REPORT- TOTAL HIP ARTHROPLASTY   PREOPERATIVE DIAGNOSIS: Osteoarthritis of the Left hip.   POSTOPERATIVE DIAGNOSIS: Osteoarthritis of the Left  hip.   PROCEDURE: Left total hip arthroplasty, anterior approach.   SURGEON: Ollen Gross, MD   ASSISTANT: Arther Abbott, PA-C  ANESTHESIA:  Spinal  ESTIMATED BLOOD LOSS:-300 mL    DRAINS: None  COMPLICATIONS: None   CONDITION: PACU - hemodynamically stable.   BRIEF CLINICAL NOTE: Suzanne Trevino is a 77 y.o. female who has advanced end-  stage arthritis of their Left  hip with progressively worsening pain and  dysfunction.The patient has failed nonoperative management and presents for  total hip arthroplasty.   PROCEDURE IN DETAIL: After successful administration of spinal  anesthetic, the traction boots for the [facility] bed were placed on both  feet and the patient was placed onto the [facility] bed, boots placed into the leg  holders. The Left hip was then isolated from the perineum with plastic  drapes and prepped and draped in the usual sterile fashion. ASIS and  greater trochanter were marked and a oblique incision was made, starting  at about 1 cm lateral and 2 cm distal to the ASIS and coursing towards  the anterior cortex of the femur. The skin was cut with a 10 blade  through subcutaneous tissue to the level of the fascia overlying the  tensor fascia lata muscle. The fascia was then incised in line with the  incision at the junction of the anterior third and posterior 2/3rd. The  muscle was teased off the fascia and then the interval between the TFL  and the rectus was developed. The Hohmann retractor was then placed at  the top of the femoral neck over the capsule. The vessels overlying the  capsule were cauterized and the fat on top of the capsule was removed.  A Hohmann retractor was then placed anterior underneath the rectus  femoris to give exposure to the entire anterior capsule. A T-shaped  capsulotomy  was performed. The edges were tagged and the femoral head  was identified.       Osteophytes are removed off the superior acetabulum.  The femoral neck was then cut in situ with an oscillating saw. Traction  was then applied to the left lower extremity utilizing the [facility]  traction. The femoral head was then removed. Retractors were placed  around the acetabulum and then circumferential removal of the labrum was  performed. Osteophytes were also removed. Reaming starts at 45 mm to  medialize and  Increased in 2 mm increments to 49 mm. We reamed in  approximately 40 degrees of abduction, 20 degrees anteversion. A 50 mm  pinnacle acetabular shell was then impacted in anatomic position under  fluoroscopic guidance with excellent purchase. We did not need to place  any additional dome screws. A 32 mm neutral + 4 marathon liner was then  placed into the acetabular shell.       The femoral lift was then placed along the lateral aspect of the femur  just distal to the vastus ridge. The leg was  externally rotated and capsule  was stripped off the inferior aspect of the femoral neck down to the  level of the lesser trochanter, this was done with electrocautery. The femur was lifted after this was performed. The  leg was then placed in an extended and adducted position essentially delivering the femur. We also removed the capsule superiorly and the piriformis from the piriformis fossa to  gain excellent exposure of the  proximal femur. Rongeur was used to remove some cancellous bone to get  into the lateral portion of the proximal femur for placement of the  initial starter reamer. The starter broaches was placed  the starter broach  and was shown to go down the center of the canal. Broaching  with the Actis system was then performed starting at size 0  coursing  Up to size 2. A size 2 had excellent torsional and rotational  and axial stability. The trial standard offset neck was then placed  with a 32  + 1 trial head. The hip was then reduced. We confirmed that  the stem was in the canal both on AP and lateral x-rays. It also has excellent sizing. The hip was reduced with outstanding stability through full extension and full external rotation.. AP pelvis was taken and the leg lengths were measured and found to be equal. Hip was then dislocated again and the femoral head and neck removed. The  femoral broach was removed. Size 2 Actis stem with a standard offset  neck was then impacted into the femur following native anteversion. Has  excellent purchase in the canal. Excellent torsional and rotational and  axial stability. It is confirmed to be in the canal on AP and lateral  fluoroscopic views. The 32 + 1  metal head was placed and the hip  reduced with outstanding stability. Again AP pelvis was taken and it  confirmed that the leg lengths were equal. The wound was then copiously  irrigated with saline solution and the capsule reattached and repaired  with Ethibond suture. 30 ml of .25% Bupivicaine was  injected into the capsule and into the edge of the tensor fascia lata as well as subcutaneous tissue. The fascia overlying the tensor fascia lata was then closed with a running #1 V-Loc. Subcu was closed with interrupted 2-0 Vicryl and subcuticular running 4-0 Monocryl. Incision was cleaned  and dried. Steri-Strips and a bulky sterile dressing applied. The patient was awakened and transported to  recovery in stable condition.        Please note that a surgical assistant was a medical necessity for this procedure to perform it in a safe and expeditious manner. Assistant was necessary to provide appropriate retraction of vital neurovascular structures and to prevent femoral fracture and allow for anatomic placement of the prosthesis.  Ollen Gross, M.D.

## 2023-08-22 NOTE — Care Plan (Signed)
Ortho Bundle Case Management Note  Patient Details  Name: Suzanne Trevino MRN: [id number] Date of Birth: 07-09-1946                  L THA on 08/22/23.  DCP: Home with sister and brother.  DME: No needs. Has RW.  PT: HEP   DME Arranged:  N/A DME Agency:       Additional Comments: Please contact me with any questions of if this plan should need to change.    Despina Pole, CCM Case Manager, [facility] [place] [phone] 08/22/2023, 12:10 PM

## 2023-08-22 NOTE — Discharge Instructions (Signed)
Ollen Gross, MD Total Joint Specialist [facility] [place] [address], [address] [place], [place] [address] [phone]  ANTERIOR APPROACH TOTAL HIP REPLACEMENT POSTOPERATIVE DIRECTIONS     Hip Rehabilitation, Guidelines Following Surgery  The results of a hip operation are greatly improved after range of motion and muscle strengthening exercises. Follow all safety measures which are given to protect your hip. If any of these exercises cause increased pain or swelling in your joint, decrease the amount until you are comfortable again. Then slowly increase the exercises. Call your caregiver if you have problems or questions.   BLOOD CLOT PREVENTION Take an 81 mg Aspirin two times a day for three weeks following surgery. Then take an 81 mg Aspirin once a day for three weeks. Then discontinue Aspirin. You may resume your vitamins/supplements upon discharge from the hospital. Do not take any NSAIDs (Advil, Aleve, Ibuprofen, Meloxicam, etc.) until you have discontinued the 81 mg Aspirin twice a day.  HOME CARE INSTRUCTIONS  Remove items at home which could result in a fall. This includes throw rugs or furniture in walking pathways.  ICE to the affected hip as frequently as 20-30 minutes an hour and then as needed for pain and swelling. Continue to use ice on the hip for pain and swelling from surgery. You may notice swelling that will progress down to the foot and ankle. This is normal after surgery. Elevate the leg when you are not up walking on it.   Continue to use the breathing machine which will help keep your temperature down.  It is common for your temperature to cycle up and down following surgery, especially at night when you are not up moving around and exerting yourself.  The breathing machine keeps your lungs expanded and your temperature down.  DIET You may resume your previous home diet once your are discharged from the hospital.  DRESSING / WOUND CARE /  SHOWERING You have an adhesive waterproof bandage over the incision. Leave this in place until your first follow-up appointment. Once you remove this you will not need to place another bandage.  You may begin showering 3 days following surgery, but do not submerge the incision under water.  ACTIVITY For the first 3-5 days, it is important to rest and keep the operative leg elevated. You should, as a general rule, rest for 50 minutes and walk/stretch for 10 minutes per hour. After 5 days, you may slowly increase activity as tolerated.  Perform the exercises you were provided twice a day for about 15-20 minutes each session. Begin these 2 days following surgery. Walk with your walker as instructed. Use the walker until you are comfortable transitioning to a cane. Walk with the cane in the opposite hand of the operative leg. You may discontinue the cane once you are comfortable and walking steadily. Avoid periods of inactivity such as sitting longer than an hour when not asleep. This helps prevent blood clots.  Do not drive a car for 6 weeks or until released by your surgeon.  Do not drive while taking narcotics.  TED HOSE STOCKINGS Wear the elastic stockings on both legs for three weeks following surgery during the day. You may remove them at night while sleeping.  WEIGHT BEARING Weight bearing as tolerated with assist device (walker, cane, etc) as directed, use it as long as suggested by your surgeon or therapist, typically at least 4-6 weeks.  POSTOPERATIVE CONSTIPATION PROTOCOL Constipation - defined medically as fewer than three stools per week and  severe constipation as less than one stool per week.  One of the most common issues patients have following surgery is constipation.  Even if you have a regular bowel pattern at home, your normal regimen is likely to be disrupted due to multiple reasons following surgery.  Combination of anesthesia, postoperative narcotics, change in appetite and  fluid intake all can affect your bowels.  In order to avoid complications following surgery, here are some recommendations in order to help you during your recovery period.  Colace (docusate) - Pick up an over-the-counter form of Colace or another stool softener and take twice a day as long as you are requiring postoperative pain medications.  Take with a full glass of water daily.  If you experience loose stools or diarrhea, hold the colace until you stool forms back up.  If your symptoms do not get better within 1 week or if they get worse, check with your doctor. Dulcolax (bisacodyl) - Pick up over-the-counter and take as directed by the product packaging as needed to assist with the movement of your bowels.  Take with a full glass of water.  Use this product as needed if not relieved by Colace only.  MiraLax (polyethylene glycol) - Pick up over-the-counter to have on hand.  MiraLax is a solution that will increase the amount of water in your bowels to assist with bowel movements.  Take as directed and can mix with a glass of water, juice, soda, coffee, or tea.  Take if you go more than two days without a movement.Do not use MiraLax more than once per day. Call your doctor if you are still constipated or irregular after using this medication for 7 days in a row.  If you continue to have problems with postoperative constipation, please contact the office for further assistance and recommendations.  If you experience "the worst abdominal pain ever" or develop nausea or vomiting, please contact the office immediatly for further recommendations for treatment.  ITCHING  If you experience itching with your medications, try taking only a single pain pill, or even half a pain pill at a time.  You can also use Benadryl over the counter for itching or also to help with sleep.   MEDICATIONS See your medication summary on the "After Visit Summary" that the nursing staff will review with you prior to discharge.   You may have some home medications which will be placed on hold until you complete the course of blood thinner medication.  It is important for you to complete the blood thinner medication as prescribed by your surgeon.  Continue your approved medications as instructed at time of discharge.  PRECAUTIONS If you experience chest pain or shortness of breath - call 911 immediately for transfer to the hospital emergency department.  If you develop a fever greater that 101 F, purulent drainage from wound, increased redness or drainage from wound, foul odor from the wound/dressing, or calf pain - CONTACT YOUR SURGEON.                                                   FOLLOW-UP APPOINTMENTS Make sure you keep all of your appointments after your operation with your surgeon and caregivers. You should call the office at the above phone number and make an appointment for approximately two weeks after the date of your surgery  or on the date instructed by your surgeon outlined in the "After Visit Summary".  RANGE OF MOTION AND STRENGTHENING EXERCISES  These exercises are designed to help you keep full movement of your hip joint. Follow your caregiver's or physical therapist's instructions. Perform all exercises about fifteen times, three times per day or as directed. Exercise both hips, even if you have had only one joint replacement. These exercises can be done on a training (exercise) mat, on the floor, on a table or on a bed. Use whatever works the best and is most comfortable for you. Use music or television while you are exercising so that the exercises are a pleasant break in your day. This will make your life better with the exercises acting as a break in routine you can look forward to.  Lying on your back, slowly slide your foot toward your buttocks, raising your knee up off the floor. Then slowly slide your foot back down until your leg is straight again.  Lying on your back spread your legs as far apart as  you can without causing discomfort.  Lying on your side, raise your upper leg and foot straight up from the floor as far as is comfortable. Slowly lower the leg and repeat.  Lying on your back, tighten up the muscle in the front of your thigh (quadriceps muscles). You can do this by keeping your leg straight and trying to raise your heel off the floor. This helps strengthen the largest muscle supporting your knee.  Lying on your back, tighten up the muscles of your buttocks both with the legs straight and with the knee bent at a comfortable angle while keeping your heel on the floor.   POST-OPERATIVE OPIOID TAPER INSTRUCTIONS: It is important to wean off of your opioid medication as soon as possible. If you do not need pain medication after your surgery it is ok to stop day one. Opioids include: Codeine, Hydrocodone(Norco, Vicodin), Oxycodone(Percocet, oxycontin) and hydromorphone amongst others.  Long term and even short term use of opiods can cause: Increased pain response Dependence Constipation Depression Respiratory depression And more.  Withdrawal symptoms can include Flu like symptoms Nausea, vomiting And more Techniques to manage these symptoms Hydrate well Eat regular healthy meals Stay active Use relaxation techniques(deep breathing, meditating, yoga) Do Not substitute Alcohol to help with tapering If you have been on opioids for less than two weeks and do not have pain than it is ok to stop all together.  Plan to wean off of opioids This plan should start within one week post op of your joint replacement. Maintain the same interval or time between taking each dose and first decrease the dose.  Cut the total daily intake of opioids by one tablet each day Next start to increase the time between doses. The last dose that should be eliminated is the evening dose.   IF YOU ARE TRANSFERRED TO A SKILLED REHAB FACILITY If the patient is transferred to a skilled rehab facility  following release from the hospital, a list of the current medications will be sent to the facility for the patient to continue.  When discharged from the skilled rehab facility, please have the facility set up the patient's Home Health Physical Therapy prior to being released. Also, the skilled facility will be responsible for providing the patient with their medications at time of release from the facility to include their pain medication, the muscle relaxants, and their blood thinner medication. If the patient is still at  the rehab facility at time of the two week follow up appointment, the skilled rehab facility will also need to assist the patient in arranging follow up appointment in our office and any transportation needs.  MAKE SURE YOU:  Understand these instructions.  Get help right away if you are not doing well or get worse.    DENTAL ANTIBIOTICS:  In most cases prophylactic antibiotics for Dental procdeures after total joint surgery are not necessary.  Exceptions are as follows:  1. History of prior total joint infection  2. Severely immunocompromised (Organ Transplant, cancer chemotherapy, Rheumatoid biologic meds such as Humera)  3. Poorly controlled diabetes (A1C &gt; 8.0, blood glucose over 200)  If you have one of these conditions, contact your surgeon for an antibiotic prescription, prior to your dental procedure.    Pick up stool softner and laxative for home use following surgery while on pain medications. Do not submerge incision under water. Please use good hand washing techniques while changing dressing each day. May shower starting three days after surgery. Please use a clean towel to pat the incision dry following showers. Continue to use ice for pain and swelling after surgery. Do not use any lotions or creams on the incision until instructed by your surgeon.

## 2023-08-22 NOTE — Transfer of Care (Signed)
Immediate Anesthesia Transfer of Care Note  Patient: Suzanne Trevino  Procedure(s) Performed: TOTAL HIP ARTHROPLASTY ANTERIOR APPROACH (Left: Hip)  Patient Location: PACU  Anesthesia Type:MAC and Spinal  Level of Consciousness: awake, alert , and oriented  Airway & Oxygen Therapy: Patient Spontanous Breathing and Patient connected to face mask oxygen  Post-op Assessment: Report given to RN and Post -op Vital signs reviewed and stable  Post vital signs: Reviewed and stable  Last Vitals:  Vitals Value Taken Time  BP    Temp    Pulse    Resp    SpO2      Last Pain:  Vitals:   08/22/23 1027  TempSrc: Oral         Complications: No notable events documented.

## 2023-08-23 ENCOUNTER — Encounter (HOSPITAL_COMMUNITY): Payer: Self-pay | Admitting: Orthopedic Surgery

## 2023-08-23 ENCOUNTER — Other Ambulatory Visit (HOSPITAL_COMMUNITY): Payer: Self-pay

## 2023-08-23 DIAGNOSIS — I10 Essential (primary) hypertension: Secondary | ICD-10-CM | POA: Diagnosis not present

## 2023-08-23 DIAGNOSIS — Z79899 Other long term (current) drug therapy: Secondary | ICD-10-CM | POA: Diagnosis not present

## 2023-08-23 DIAGNOSIS — Z87891 Personal history of nicotine dependence: Secondary | ICD-10-CM | POA: Diagnosis not present

## 2023-08-23 DIAGNOSIS — M1612 Unilateral primary osteoarthritis, left hip: Secondary | ICD-10-CM | POA: Diagnosis not present

## 2023-08-23 LAB — BASIC METABOLIC PANEL
Anion gap: 7 (ref 5–15)
BUN: 14 mg/dL (ref 8–23)
CO2: 26 mmol/L (ref 22–32)
Calcium: 8.8 mg/dL — ABNORMAL LOW (ref 8.9–10.3)
Chloride: 104 mmol/L (ref 98–111)
Creatinine, Ser: 0.57 mg/dL (ref 0.44–1.00)
GFR, Estimated: >60 mL/min (ref >60)
Glucose, Bld: 156 mg/dL — ABNORMAL HIGH (ref 70–99)
Potassium: 3.6 mmol/L (ref 3.5–5.1)
Sodium: 137 mmol/L (ref 135–145)

## 2023-08-23 LAB — CBC
HCT: 34.2 % — ABNORMAL LOW (ref 36.0–46.0)
Hemoglobin: 11.2 g/dL — ABNORMAL LOW (ref 12.0–15.0)
MCH: 29.7 pg (ref 26.0–34.0)
MCHC: 32.7 g/dL (ref 30.0–36.0)
MCV: 90.7 fL (ref 80.0–100.0)
Platelets: 349 10*3/uL (ref 150–400)
RBC: 3.77 MIL/uL — ABNORMAL LOW (ref 3.87–5.11)
RDW: 12.9 % (ref 11.5–15.5)
WBC: 9.1 10*3/uL (ref 4.0–10.5)
nRBC: 0 % (ref 0.0–0.2)

## 2023-08-23 MED ORDER — ONDANSETRON HCL 4 MG PO TABS
4.0000 mg | ORAL_TABLET | Freq: Four times a day (QID) | ORAL | 0 refills | Status: DC | PRN
  Filled 2023-08-23: qty 20, 5d supply, fill #0

## 2023-08-23 MED ORDER — METHOCARBAMOL 500 MG PO TABS
500.0000 mg | ORAL_TABLET | Freq: Four times a day (QID) | ORAL | 0 refills | Status: DC | PRN
  Filled 2023-08-23: qty 40, 10d supply, fill #0

## 2023-08-23 MED ORDER — ASPIRIN 81 MG PO CHEW
81.0000 mg | CHEWABLE_TABLET | Freq: Two times a day (BID) | ORAL | 0 refills | Status: AC
  Filled 2023-08-23: qty 40, 20d supply, fill #0

## 2023-08-23 MED ORDER — HYDROCODONE-ACETAMINOPHEN 5-325 MG PO TABS
1.0000 | ORAL_TABLET | Freq: Four times a day (QID) | ORAL | 0 refills | Status: DC | PRN
  Filled 2023-08-23: qty 42, 6d supply, fill #0

## 2023-08-23 NOTE — TOC Transition Note (Signed)
Transition of Care [facility]) - CM/SW Discharge Note   Patient Details  Name: Suzanne Trevino MRN: [id number] Date of Birth: [birth year]  Transition of Care [facility]) CM/SW Contact:  Amada Jupiter, LCSW Phone Number: 08/23/2023, 1:08 PM   Clinical Narrative:     Met with pt who confirms she has needed DME in the home.  Plan for HEP.  No TOC needs.  Final next level of care: Home/Self Care Barriers to Discharge: No Barriers Identified   Patient Goals and CMS Choice      Discharge Placement                         Discharge Plan and Services Additional resources added to the After Visit Summary for                  DME Arranged: N/A                    Social Determinants of Health (SDOH) Interventions SDOH Screenings   Food Insecurity: Patient Declined (08/22/2023)  Housing: Patient Declined (08/22/2023)  Transportation Needs: Patient Declined (08/22/2023)  Utilities: Patient Declined (08/22/2023)  Tobacco Use: Medium Risk (08/22/2023)     Readmission Risk Interventions     No data to display

## 2023-08-23 NOTE — Progress Notes (Signed)
   Subjective: 1 Day Post-Op Procedure(s) (LRB): TOTAL HIP ARTHROPLASTY ANTERIOR APPROACH (Left) Patient seen in rounds by Dr. Lequita Halt. Patient is well, and has had no acute complaints or problems. Denies SOB or chest pain. Denies calf pain. Foley cath removed this AM. Patient reports pain as moderate. Worked with physical therapy yesterday and ambulated 40'. We will continue physical therapy today.   Objective: Vital signs in last 24 hours: Temp:  [97.7 F (36.5 C)-98.8 F (37.1 C)] 98.4 F (36.9 C) (11/21 0600) Pulse Rate:  [61-116] 78 (11/21 0600) Resp:  [9-19] 15 (11/21 0600) BP: (99-163)/(53-91) 141/67 (11/21 0600) SpO2:  [93 %-100 %] 98 % (11/21 0600) Weight:  [72.1 kg] 72.1 kg (11/20 1013)  Intake/Output from previous day:  Intake/Output Summary (Last 24 hours) at 08/23/2023 0744 Last data filed at 08/23/2023 0651 Gross per 24 hour  Intake 2581.25 ml  Output 1650 ml  Net 931.25 ml     Intake/Output this shift: No intake/output data recorded.  Labs: Recent Labs    08/23/23 0346  HGB 11.2*   Recent Labs    08/23/23 0346  WBC 9.1  RBC 3.77*  HCT 34.2*  PLT 349   Recent Labs    08/23/23 0346  NA 137  K 3.6  CL 104  CO2 26  BUN 14  CREATININE 0.57  GLUCOSE 156*  CALCIUM 8.8*   No results for input(s): "LABPT", "INR" in the last 72 hours.  Exam: General - Patient is Alert and Oriented Extremity - Neurologically intact Neurovascular intact Sensation intact distally Dorsiflexion/Plantar flexion intact Dressing - dressing C/D/I Motor Function - intact, moving foot and toes well on exam.  Past Medical History:  Diagnosis Date   Arthritis    Hypertension    Hyperthyroidism     Assessment/Plan: 1 Day Post-Op Procedure(s) (LRB): TOTAL HIP ARTHROPLASTY ANTERIOR APPROACH (Left) Principal Problem:   OA (osteoarthritis) of hip Active Problems:   Osteoarthritis of left hip  Estimated body mass index is 32.11 kg/m as calculated from the  following:   Height as of this encounter: 4\' 11"  (1.499 m).   Weight as of this encounter: 72.1 kg. Advance diet Up with therapy D/C IV fluids  DVT Prophylaxis - Aspirin Weight bearing as tolerated.  Continue physical therapy. Expected discharge home today pending progress with physical therapy and if meeting patient goals. Will do HEP once discharged. Follow-up in clinic in 2 weeks.  The PDMP database was reviewed today prior to any opioid medications being prescribed to this patient.  R. Arcola Jansky, PA-C Orthopedic Surgery [phone] 08/23/2023, 7:44 AM

## 2023-08-23 NOTE — Plan of Care (Signed)
  Problem: Education: Goal: Knowledge of General Education information will improve Description: Including pain rating scale, medication(s)/side effects and non-pharmacologic comfort measures Outcome: Progressing   Problem: Activity: Goal: Risk for activity intolerance will decrease Outcome: Progressing   Problem: Pain Management: Goal: General experience of comfort will improve Outcome: Progressing

## 2023-08-23 NOTE — Progress Notes (Signed)
Physical Therapy Treatment Patient Details Name: Suzanne Trevino MRN: [id number] DOB: [birth year] Today's Date: 08/23/2023   History of Present Illness 77 yo female presents to therapy s/p L THA, anterior approach due to failure of conservative measures. Pt PMH includes but is not limited to: L thyroid nodule, hyperthyroidism, cholecystis, HTN, and multiple R hip arthroscopies with leg length discrepancy at baseline R < L.    PT Comments  Pt is progressing well with mobility, she ambulated 130' with RW, no loss of balance. Stair training completed. She demonstrates good understanding of HEP. She is ready to DC home from a PT standpoint.    If plan is discharge home, recommend the following: A little help with bathing/dressing/bathroom;Assistance with cooking/housework;Assist for transportation;Help with stairs or ramp for entrance   Can travel by private vehicle        Equipment Recommendations  None recommended by PT    Recommendations for Other Services       Precautions / Restrictions Precautions Precautions: Fall Restrictions Weight Bearing Restrictions: No     Mobility  Bed Mobility Overal bed mobility: Modified Independent Bed Mobility: Supine to Sit     Supine to sit: Modified independent (Device/Increase time), HOB elevated, Used rails          Transfers Overall transfer level: Needs assistance Equipment used: Rolling walker (2 wheels) Transfers: Sit to/from Stand Sit to Stand: Supervision           General transfer comment: VCs for hand placement    Ambulation/Gait Ambulation/Gait assistance: Supervision Gait Distance (Feet): 130 Feet Assistive device: Rolling walker (2 wheels) Gait Pattern/deviations: Step-to pattern, Decreased step length - right, Decreased step length - left Gait velocity: decreased     General Gait Details: steady, no loss of balance   Stairs Stairs: Yes Stairs assistance: Supervision Stair Management: One rail Left,  Forwards, With cane, Step to pattern Number of Stairs: 3 General stair comments: 3 steps x 2 trials, VCs sequencing   Wheelchair Mobility     Tilt Bed    Modified Rankin (Stroke Patients Only)       Balance Overall balance assessment: Needs assistance Sitting-balance support: Feet supported Sitting balance-Leahy Scale: Good     Standing balance support: Bilateral upper extremity supported, During functional activity, Reliant on assistive device for balance Standing balance-Leahy Scale: Fair Standing balance comment: static standing no UE support                            Cognition Arousal: Alert Behavior During Therapy: WFL for tasks assessed/performed Overall Cognitive Status: Within Functional Limits for tasks assessed                                          Exercises Total Joint Exercises Ankle Circles/Pumps: AROM, Both, 15 reps Quad Sets: AROM, Both, 5 reps, Supine Short Arc Quad: AROM, Left, 5 reps, Supine Heel Slides: AAROM, Left, 5 reps, Supine Hip ABduction/ADduction: AAROM, Left, 5 reps, Supine Long Arc Quad: AROM, Left, 5 reps, Seated    General Comments        Pertinent Vitals/Pain Pain Assessment Pain Score: 4  Pain Location: L hip Pain Descriptors / Indicators: Sore, Tightness Pain Intervention(s): Limited activity within patient's tolerance, Monitored during session, Premedicated before session, Ice applied    Home Living  Prior Function            PT Goals (current goals can now be found in the care plan section) Acute Rehab PT Goals Patient Stated Goal: to be able to do all the activities I was before the L hip pain started, travel and go to softball games PT Goal Formulation: With patient Time For Goal Achievement: 09/12/23 Potential to Achieve Goals: Good    Frequency    7X/week      PT Plan      Co-evaluation              AM-PAC PT "6 Clicks"  Mobility   Outcome Measure  Help needed turning from your back to your side while in a flat bed without using bedrails?: None Help needed moving from lying on your back to sitting on the side of a flat bed without using bedrails?: A Little Help needed moving to and from a bed to a chair (including a wheelchair)?: None Help needed standing up from a chair using your arms (e.g., wheelchair or bedside chair)?: None Help needed to walk in hospital room?: None Help needed climbing 3-5 steps with a railing? : A Little 6 Click Score: 22    End of Session Equipment Utilized During Treatment: Gait belt Activity Tolerance: No increased pain;Patient tolerated treatment well Patient left: in chair;with call bell/phone within reach;with chair alarm set Nurse Communication: Mobility status PT Visit Diagnosis: Unsteadiness on feet (R26.81);Other abnormalities of gait and mobility (R26.89);Muscle weakness (generalized) (M62.81);Pain;Difficulty in walking, not elsewhere classified (R26.2) Pain - Right/Left: Left Pain - part of body: Hip     Time: 1610-9604 PT Time Calculation (min) (ACUTE ONLY): 38 min  Charges:    $Gait Training: 8-22 mins $Therapeutic Exercise: 8-22 mins $Therapeutic Activity: 8-22 mins PT General Charges $$ ACUTE PT VISIT: 1 Visit                    Tamala Ser PT 08/23/2023  Acute Rehabilitation Services  Office [phone]

## 2023-08-23 NOTE — Discharge Summary (Signed)
Physician Discharge Summary   Patient ID: Suzanne Trevino MRN: [id number] DOB/AGE: Aug 07, 1946 78 y.o.  Admit date: 08/22/2023 Discharge date: 08/23/2023  Primary Diagnosis: Osteoarthritis of the left hip  Admission Diagnoses:  Past Medical History:  Diagnosis Date   Arthritis    Hypertension    Hyperthyroidism    Discharge Diagnoses:   Principal Problem:   OA (osteoarthritis) of hip Active Problems:   Osteoarthritis of left hip  Estimated body mass index is 32.11 kg/m as calculated from the following:   Height as of this encounter: 4\' 11"  (1.499 m).   Weight as of this encounter: 72.1 kg.  Procedure:  Procedure(s) (LRB): TOTAL HIP ARTHROPLASTY ANTERIOR APPROACH (Left)   Consults: None  HPI: Suzanne Trevino is a 77 y.o. female who has advanced end-stage arthritis of their Left  hip with progressively worsening pain and dysfunction.The patient has failed nonoperative management and presents for  total hip arthroplasty.   Laboratory Data: Admission on 08/22/2023, Discharged on 08/23/2023  Component Date Value Ref Range Status   ABO/RH(D) 08/22/2023    Final                   Value:A POS Performed at [facility], [address] [place], [place] [address]    WBC 08/23/2023 9.1  4.0 - 10.5 K/uL Final   RBC 08/23/2023 3.77 (L)  3.87 - 5.11 MIL/uL Final   Hemoglobin 08/23/2023 11.2 (L)  12.0 - 15.0 g/dL Final   HCT 41/32/4401 34.2 (L)  36.0 - 46.0 % Final   MCV 08/23/2023 90.7  80.0 - 100.0 fL Final   MCH 08/23/2023 29.7  26.0 - 34.0 pg Final   MCHC 08/23/2023 32.7  30.0 - 36.0 g/dL Final   RDW 02/72/5366 12.9  11.5 - 15.5 % Final   Platelets 08/23/2023 349  150 - 400 K/uL Final   nRBC 08/23/2023 0.0  0.0 - 0.2 % Final   Performed at [facility], [address] [place], [place] [address]   Sodium 08/23/2023 137  135 - 145 mmol/L Final   Potassium 08/23/2023 3.6  3.5 - 5.1 mmol/L Final   Chloride 08/23/2023 104  98 - 111 mmol/L  Final   CO2 08/23/2023 26  22 - 32 mmol/L Final   Glucose, Bld 08/23/2023 156 (H)  70 - 99 mg/dL Final   Glucose reference range applies only to samples taken after fasting for at least 8 hours.   BUN 08/23/2023 14  8 - 23 mg/dL Final   Creatinine, Ser 08/23/2023 0.57  0.44 - 1.00 mg/dL Final   Calcium 74/25/9563 8.8 (L)  8.9 - 10.3 mg/dL Final   GFR, Estimated 08/23/2023 >60  >60 mL/min Final   Comment: (NOTE) Calculated using the CKD-EPI Creatinine Equation (2021)    Anion gap 08/23/2023 7  5 - 15 Final   Performed at [facility], [address] [place], [place] [address]  Hospital Outpatient Visit on 08/13/2023  Component Date Value Ref Range Status   Sodium 08/13/2023 139  135 - 145 mmol/L Final   Potassium 08/13/2023 4.0  3.5 - 5.1 mmol/L Final   Chloride 08/13/2023 104  98 - 111 mmol/L Final   CO2 08/13/2023 25  22 - 32 mmol/L Final   Glucose, Bld 08/13/2023 148 (H)  70 - 99 mg/dL Final   Glucose reference range applies only to samples taken after fasting for at least 8 hours.   BUN 08/13/2023 12  8 -  23 mg/dL Final   Creatinine, Ser 08/13/2023 0.61  0.44 - 1.00 mg/dL Final   Calcium 16/07/9603 9.5  8.9 - 10.3 mg/dL Final   GFR, Estimated 08/13/2023 >60  >60 mL/min Final   Comment: (NOTE) Calculated using the CKD-EPI Creatinine Equation (2021)    Anion gap 08/13/2023 10  5 - 15 Final   Performed at [facility], [address], [place], [place] [address]   WBC 08/13/2023 5.8  4.0 - 10.5 K/uL Final   RBC 08/13/2023 4.91  3.87 - 5.11 MIL/uL Final   Hemoglobin 08/13/2023 14.4  12.0 - 15.0 g/dL Final   HCT 11/91/4782 44.6  36.0 - 46.0 % Final   MCV 08/13/2023 90.8  80.0 - 100.0 fL Final   MCH 08/13/2023 29.3  26.0 - 34.0 pg Final   MCHC 08/13/2023 32.3  30.0 - 36.0 g/dL Final   RDW 95/62/1308 12.8  11.5 - 15.5 % Final   Platelets 08/13/2023 429 (H)  150 - 400 K/uL Final   nRBC 08/13/2023 0.0  0.0 - 0.2 % Final   Performed at [facility], [address] [place], [place] [address]   MRSA, PCR 08/13/2023 NEGATIVE  NEGATIVE Final   Staphylococcus aureus 08/13/2023 POSITIVE (A)  NEGATIVE Final   Comment: (NOTE) The Xpert SA Assay (FDA approved for NASAL specimens in patients 44 years of age and older), is one component of a comprehensive surveillance program. It is not intended to diagnose infection nor to guide or monitor treatment. Performed at [facility], [address], [place], [place] [address]    ABO/RH(D) 08/13/2023 A POS   Final   Antibody Screen 08/13/2023 NEG   Final   Sample Expiration 08/13/2023 08/25/2023,2359   Final   Extend sample reason 08/13/2023    Final                   Value:NO TRANSFUSIONS OR PREGNANCY IN THE PAST 3 MONTHS Performed at [facility], [address], [place], [place] [address]      X-Rays:DG Pelvis Portable  Result Date: 08/22/2023 CLINICAL DATA:  Left hip arthroplasty. EXAM: PORTABLE PELVIS 1-2 VIEWS COMPARISON:  None Available. FINDINGS: Left hip arthroplasty in expected alignment. No periprosthetic lucency or fracture. Recent postsurgical change includes air and edema in the soft tissues. Previous right hip arthroplasty, presumed prior right arthroplasty revision. IMPRESSION: Left hip arthroplasty without immediate postoperative complication. Electronically Signed   By: Narda Rutherford M.D.   On: 08/22/2023 14:37   DG HIP UNILAT WITH PELVIS 1V LEFT  Result Date: 08/22/2023 CLINICAL DATA:  Elective surgery. EXAM: DG HIP (WITH OR WITHOUT PELVIS) 1V*L* COMPARISON:  None Available. FINDINGS: Seven fluoroscopic spot views of the pelvis and left hip obtained in the operating room. Sequential images during hip arthroplasty. Fluoroscopy time 6 seconds. Dose 0.834 mGy. IMPRESSION: Intraoperative fluoroscopy during left hip arthroplasty. Electronically Signed   By: Narda Rutherford M.D.   On: 08/22/2023 14:37   DG C-Arm 1-60  Min-No Report  Result Date: 08/22/2023 Fluoroscopy was utilized by the requesting physician.  No radiographic interpretation.   DG C-Arm 1-60 Min-No Report  Result Date: 08/22/2023 Fluoroscopy was utilized by the requesting physician.  No radiographic interpretation.    EKG: Orders placed or performed during the hospital encounter of 08/13/23   EKG 12 lead per protocol   EKG 12 lead per protocol     Hospital Course: Suzanne Trevino is a 77 y.o. who  was admitted to [facility]. They were brought to the operating room on 08/22/2023 and underwent Procedure(s): TOTAL HIP ARTHROPLASTY ANTERIOR APPROACH.  Patient tolerated the procedure well and was later transferred to the recovery room and then to the orthopaedic floor for postoperative care. They were given PO and IV analgesics for pain control following their surgery. They were given 24 hours of postoperative antibiotics of  Anti-infectives (From admission, onward)    Start     Dose/Rate Route Frequency Ordered Stop   08/22/23 1800  ceFAZolin (ANCEF) IVPB 2g/100 mL premix        2 g 200 mL/hr over 30 Minutes Intravenous Every 6 hours 08/22/23 1510 08/23/23 0831   08/22/23 1015  ceFAZolin (ANCEF) IVPB 2g/100 mL premix        2 g 200 mL/hr over 30 Minutes Intravenous On call to O.R. 08/22/23 1006 08/22/23 1208     and started on DVT prophylaxis in the form of Aspirin.   PT and OT were ordered for total joint protocol. Discharge planning consulted to help with postop disposition and equipment needs.  Patient had a fair night on the evening of surgery. They started to get up OOB with therapy on POD #0. Pt was seen during rounds and was ready to go home pending progress with therapy. She worked with therapy on POD #1 and was meeting her goals. Pt was discharged to home later that day in stable condition.  Diet: Regular diet Activity: WBAT Follow-up: in 2 weeks Disposition: Home Discharged Condition: stable   Discharge  Instructions     Call MD / Call 911   Complete by: As directed    If you experience chest pain or shortness of breath, CALL 911 and be transported to the hospital emergency room.  If you develope a fever above 101 F, pus (white drainage) or increased drainage or redness at the wound, or calf pain, call your surgeon's office.   Change dressing   Complete by: As directed    You have an adhesive waterproof bandage over the incision. Leave this in place until your first follow-up appointment. Once you remove this you will not need to place another bandage.   Constipation Prevention   Complete by: As directed    Drink plenty of fluids.  Prune juice may be helpful.  You may use a stool softener, such as Colace (over the counter) 100 mg twice a day.  Use MiraLax (over the counter) for constipation as needed.   Diet - low sodium heart healthy   Complete by: As directed    Do not sit on low chairs, stoools or toilet seats, as it may be difficult to get up from low surfaces   Complete by: As directed    Driving restrictions   Complete by: As directed    No driving for two weeks   Post-operative opioid taper instructions:   Complete by: As directed    POST-OPERATIVE OPIOID TAPER INSTRUCTIONS: It is important to wean off of your opioid medication as soon as possible. If you do not need pain medication after your surgery it is ok to stop day one. Opioids include: Codeine, Hydrocodone(Norco, Vicodin), Oxycodone(Percocet, oxycontin) and hydromorphone amongst others.  Long term and even short term use of opiods can cause: Increased pain response Dependence Constipation Depression Respiratory depression And more.  Withdrawal symptoms can include Flu like symptoms Nausea, vomiting And more Techniques to manage these symptoms Hydrate well Eat regular healthy meals Stay active Use relaxation  techniques(deep breathing, meditating, yoga) Do Not substitute Alcohol to help with tapering If you have  been on opioids for less than two weeks and do not have pain than it is ok to stop all together.  Plan to wean off of opioids This plan should start within one week post op of your joint replacement. Maintain the same interval or time between taking each dose and first decrease the dose.  Cut the total daily intake of opioids by one tablet each day Next start to increase the time between doses. The last dose that should be eliminated is the evening dose.      TED hose   Complete by: As directed    Use stockings (TED hose) for three weeks on both leg(s).  You may remove them at night for sleeping.   Weight bearing as tolerated   Complete by: As directed       Allergies as of 08/23/2023       Reactions   Altace [ramipril]    Tongue swelling   Vytorin [ezetimibe-simvastatin] Anaphylaxis   Gabapentin    Neosporin [neomycin-bacitracin Zn-polymyx] Other (See Comments)   Eye burning   Sulfa Antibiotics    unknown   Tramadol Hcl    Triamterene    Cramps   Zetia [ezetimibe] Other (See Comments)   Chest pain   Statins Rash        Medication List     TAKE these medications    acetaminophen 325 MG tablet Commonly known as: TYLENOL Take 650 mg by mouth every 6 (six) hours as needed for moderate pain (pain score 4-6).   amLODipine 10 MG tablet Commonly known as: NORVASC Take 10 mg by mouth daily.   Aspirin Low Dose 81 MG chewable tablet Generic drug: aspirin Chew 1 tablet (81 mg total) by mouth 2 (two) times daily for 20 days. Then take one 81 mg aspirin once a day for three weeks. Then discontinue aspirin.   Calcium-Vitamin D3 600-400 MG-UNIT Caps Take 1 tablet by mouth daily.   fenofibrate 160 MG tablet Take 160 mg by mouth daily.   HYDROcodone-acetaminophen 5-325 MG tablet Commonly known as: NORCO/VICODIN Take 1-2 tablets by mouth every 6 (six) hours as needed for severe pain (pain score 7-10).   methimazole 5 MG tablet Commonly known as: TAPAZOLE Take 1 tablet  (5 mg total) by mouth as directed. Half a tablet on Saturday, half a tablet on Sunday, and 1 tablet the rest of the week   methocarbamol 500 MG tablet Commonly known as: ROBAXIN Take 1 tablet (500 mg total) by mouth every 6 (six) hours as needed for muscle spasms.   multivitamin with minerals tablet Take 1 tablet by mouth daily.   ondansetron 4 MG tablet Commonly known as: ZOFRAN Take 1 tablet (4 mg total) by mouth every 6 (six) hours as needed for nausea.               Discharge Care Instructions  (From admission, onward)           Start     Ordered   08/23/23 0000  Weight bearing as tolerated        11 /21/24 0747   08/23/23 0000  Change dressing       Comments: You have an adhesive waterproof bandage over the incision. Leave this in place until your first follow-up appointment. Once you remove this you will not need to place another bandage.   08/23/23 0747  Follow-up Information     Ollen Gross, MD. Go on 09/04/2023.   Specialty: Orthopedic Surgery Why: You are scheduled for first post op appt on Tuesday December 3 at 1:00pm. Contact information: [address] [place] [address] [phone]                 Signed: R. Arcola Jansky, PA-C Orthopedic Surgery 08/23/2023, 4:32 PM

## 2023-10-02 DIAGNOSIS — Z5189 Encounter for other specified aftercare: Secondary | ICD-10-CM | POA: Diagnosis not present

## 2023-12-05 NOTE — Progress Notes (Signed)
 Name: Suzanne Trevino  MRN/ DOB: [id number], [birth year]    Age/ Sex: 78 y.o., female    PCP: Blair Heys, MD (Inactive)   Reason for Endocrinology Evaluation: Hyperthyroidism     Date of Initial Endocrinology Evaluation: 01/25/2022    HPI: [name] is a 78 y.o. female with a past medical history of HTN. The patient presented for initial endocrinology clinic visit on 01/25/2022 for consultative assistance with her Hyperthyroidism.   During evaluation for weight loss in September 2022 she was noted with hyperthyroidism with a suppressed TSH at 0.08 uIU/mL and elevated free T4 at 1.39 (0.61-1.12) and undetectable anti-TPO antibodies.    An uptake and scan was performed on 09/06/2021 revealing normal uptake at 24% of I-123 with a left lobe cold nodule. Thyroid ultrasound showed a complex cystic area on the left lobe which was biopsied in 2017 with benign cytology.   On her initial visit to our clinic she had an undetectable TRAb  Repeat thyroid ultrasound on November 17, 2021 revealed heterogeneous thyroid gland without discrete nodules.  On her initial visit to our clinic her TSH was low at 0.19 u U IU/mL, normal T3 and free T4.  She was started on methimazole at the time  No prior exposure to radiation  No prior dx of osteoporosis nor cardiac arrythmia pp  Sister with hypothyroidism   SUBJECTIVE:     Today (12/06/23): [name] is here for follow-up on multinodular goiter and hyperthyroidism.  S/P left hip surgery 08/2023, has hx of multiple right hip sx  Weight stable  Denies local neck swelling  Denies palpitations Denies  diarrhea or constipation  No Biotin intake    Methimazole 5 mg, half a tablet on Saturday and Sunday, and 1 tablet the rest of the week    HISTORY:  Past Medical History:  Past Medical History:  Diagnosis Date   Arthritis    Hypertension    Hyperthyroidism    Past Surgical History:  Past Surgical History:  Procedure  Laterality Date   cataract Bilateral    2022   CESAREAN SECTION     x2   CHOLECYSTECTOMY N/A 11/22/2016   Procedure: LAPAROSCOPIC CHOLECYSTECTOMY;  Surgeon: James Wyatt, MD;  Location: [facility];  Service: General;  Laterality: N/A;   COLONOSCOPY     20 22   HIP ARTHROSCOPY Right    x2   KNEE ARTHROPLASTY Left    2022   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 Her smoking use included cigarettes. She has never used smokeless tobacco. She reports that she does not drink alcohol and does not use drugs. Family History: family history is not on file.   HOME MEDICATIONS: Allergies as of 12/06/2023       Reactions   Altace [ramipril]    Tongue swelling   Vytorin [ezetimibe-simvastatin] Anaphylaxis   Gabapentin    Neosporin [neomycin-bacitracin Zn-polymyx] Other (See Comments)   Eye burning   Sulfa Antibiotics    unknown   Tramadol Hcl    Triamterene    Cramps   Zetia [ezetimibe] Other (See Comments)   Chest pain   Statins Rash        Medication List        Accurate as of December 06, 2023  8:13  AM. If you have any questions, ask your nurse or doctor.          STOP taking these medications    acetaminophen 325 MG tablet Commonly known as: TYLENOL Stopped by: Johnney Ou Augusten Lipkin   HYDROcodone-acetaminophen 5-325 MG tablet Commonly known as: NORCO/VICODIN Stopped by: Johnney Ou Haleemah Buckalew   methocarbamol 500 MG tablet Commonly known as: ROBAXIN Stopped by: Johnney Ou Jamorion Gomillion   ondansetron 4 MG tablet Commonly known as: ZOFRAN Stopped by: Johnney Ou Dawayne Ohair       TAKE these medications    amLODipine 10 MG tablet Commonly known as: NORVASC Take 10 mg by mouth daily.   Boostrix 5-2.5-18.5 LF-MCG/0.5 injection Generic drug: Tdap    Calcium-Vitamin D3 600-400 MG-UNIT Caps Take 1 tablet by mouth daily.   fenofibrate 160 MG tablet Take 160 mg by mouth daily.   methimazole 5 MG tablet Commonly known as: TAPAZOLE Take 1 tablet (5 mg total) by mouth as directed. Half a tablet on Saturday, half a tablet on Sunday, and 1 tablet the rest of the week   multivitamin with minerals tablet Take 1 tablet by mouth daily.          REVIEW OF SYSTEMS: A comprehensive ROS was conducted with the patient and is negative except as per HPI    OBJECTIVE:  VS: BP 126/74 (BP Location: Left Arm, Patient Position: Sitting, Cuff Size: Normal)   Pulse 100   Ht 4\' 11"  (1.499 m)   Wt 156 lb 12.8 oz (71.1 kg)   SpO2 95%   BMI 31.67 kg/m    Wt Readings from Last 3 Encounters:  12/06/23 156 lb 12.8 oz (71.1 kg)  08/22/23 159 lb (72.1 kg)  08/13/23 159 lb (72.1 kg)     EXAM: General: Pt appears well and is in NAD  Neck: General: Supple without adenopathy. Thyroid: No goiter or nodules appreciated  Lungs: Clear with good BS bilat   Heart: Auscultation: RRR.  Extremities:  BL LE: No pretibial edema   Mental Status: Judgment, insight: Intact Orientation: Oriented to time, place, and person Mood and affect: No depression, anxiety, or agitation     DATA REVIEWED:   Latest Reference Range & Units 06/08/23 08:20  TSH 0.35 - 5.50 uIU/mL 1.32  T4,Free(Direct) 0.60 - 1.60 ng/dL 1.47    Thyroid uptake and scan 09/06/2021  A cold nodule is identified within the left thyroid lobe within the mid to upper pole. Otherwise uniform distribution of radiotracer.   4 hour I-123 uptake = 12% (normal 5-20%)   24 hour I-123 uptake = 24% (normal 10-30%)   IMPRESSION: Normal 4 and 24 hour iodine uptake.   Cold nodule within the left thyroid lobe. Correlation with dedicated thyroid sonography is recommended.  Thyroid ultrasound 11/17/2021  I personally examined the patient with ultrasound. Multiple pseudo nodules throughout the  right thyroid lobe without a discrete right thyroid nodule.   Left thyroid lobe is markedly enlarged. There are complex cystic areas within the midportion of the left thyroid lobe with likely accounts for the cold nodule on the thyroid scan. This cystic area was present in 2017 but there is increased fluid compared to 2017. There is not a discrete cystic nodule in this area but this area is most compatible with a TR 2 nodule since there is mixed cystic and solid composition with isoechoic echogenicity. The left mid thyroid lobe cystic area does not meet criteria for biopsy or dedicated follow-up. Again noted is markedly heterogeneous tissue in  the left inferior thyroid lobe that corresponds with the previously biopsied area.   IMPRESSION: 1. Thyroid is enlarged and diffusely heterogeneous without discrete suspicious nodules. The cold area on the previous thyroid scan likely corresponds with the complex cystic area within the left mid thyroid lobe. No areas meet criteria for ultrasound-guided biopsy at this time.    FNA left lobe 12/21/2015  THYROID, LEFT LOBE, FINE NEEDLE ASPIRATION (SPECIMEN 1 OF 1, COLLECTED ON 12/21/2015): CONSISTENT WITH BENIGN FOLLICULAR NODULE (BETHESDA CATEGORY II). ROBERT HILLARD ASSESSMENT/PLAN/RECOMMENDATIONS:   Hyperthyroidism    -Patient is clinically euthyroid  -No local neck symptoms -Thyroid uptake and scan showed increased uptake at the upper lobes bilaterally -TFTs are within normal range, I will decrease methimazole as below    Medications : Continue methimazole 5 mg, half a tablet on Saturday, none on Sundays, and 1 tablet the rest of the week    Follow-up in 6 months   Signed electronically by: Lyndle Herrlich, MD  [facility] Endocrinology  [facility] [address] [place] [place], [place] [address] Phone: [phone] FAX: [phone]   CC: Blair Heys, MD (Inactive) [address]  [place] [address] Phone: [phone] Fax: [phone]   Return to Endocrinology clinic as below: No future appointments.

## 2023-12-06 ENCOUNTER — Ambulatory Visit: Payer: Medicare HMO | Admitting: Internal Medicine

## 2023-12-06 ENCOUNTER — Encounter: Payer: Self-pay | Admitting: Internal Medicine

## 2023-12-06 VITALS — BP 126/74 | HR 100 | Ht 59.0 in | Wt 156.8 lb

## 2023-12-06 DIAGNOSIS — E0590 Thyrotoxicosis, unspecified without thyrotoxic crisis or storm: Secondary | ICD-10-CM | POA: Diagnosis not present

## 2023-12-07 ENCOUNTER — Encounter: Payer: Self-pay | Admitting: Internal Medicine

## 2023-12-07 LAB — T4, FREE: Free T4: 1.3 ng/dL (ref 0.8–1.8)

## 2023-12-07 LAB — TSH: TSH: 2.04 m[IU]/L (ref 0.40–4.50)

## 2023-12-07 MED ORDER — METHIMAZOLE 5 MG PO TABS: 5.0000 mg | ORAL_TABLET | ORAL | 3 refills | Status: DC

## 2024-02-19 DIAGNOSIS — Z1231 Encounter for screening mammogram for malignant neoplasm of breast: Secondary | ICD-10-CM | POA: Diagnosis not present

## 2024-03-31 DIAGNOSIS — E785 Hyperlipidemia, unspecified: Secondary | ICD-10-CM | POA: Diagnosis not present

## 2024-03-31 DIAGNOSIS — M810 Age-related osteoporosis without current pathological fracture: Secondary | ICD-10-CM | POA: Diagnosis not present

## 2024-03-31 DIAGNOSIS — I10 Essential (primary) hypertension: Secondary | ICD-10-CM | POA: Diagnosis not present

## 2024-04-18 ENCOUNTER — Encounter: Payer: Self-pay | Admitting: Advanced Practice Midwife

## 2024-05-01 DIAGNOSIS — I10 Essential (primary) hypertension: Secondary | ICD-10-CM | POA: Diagnosis not present

## 2024-05-01 DIAGNOSIS — E785 Hyperlipidemia, unspecified: Secondary | ICD-10-CM | POA: Diagnosis not present

## 2024-05-01 DIAGNOSIS — M810 Age-related osteoporosis without current pathological fracture: Secondary | ICD-10-CM | POA: Diagnosis not present

## 2024-06-01 DIAGNOSIS — E785 Hyperlipidemia, unspecified: Secondary | ICD-10-CM | POA: Diagnosis not present

## 2024-06-01 DIAGNOSIS — I10 Essential (primary) hypertension: Secondary | ICD-10-CM | POA: Diagnosis not present

## 2024-06-01 DIAGNOSIS — M810 Age-related osteoporosis without current pathological fracture: Secondary | ICD-10-CM | POA: Diagnosis not present

## 2024-06-09 ENCOUNTER — Encounter: Payer: Self-pay | Admitting: Internal Medicine

## 2024-06-09 ENCOUNTER — Ambulatory Visit: Admitting: Internal Medicine

## 2024-06-09 VITALS — BP 128/74 | HR 104 | Ht 59.0 in | Wt 158.0 lb

## 2024-06-09 DIAGNOSIS — E0590 Thyrotoxicosis, unspecified without thyrotoxic crisis or storm: Secondary | ICD-10-CM

## 2024-06-09 LAB — T4, FREE: Free T4: 1.4 ng/dL (ref 0.8–1.8)

## 2024-06-09 LAB — TSH: TSH: 1.66 m[IU]/L (ref 0.40–4.50)

## 2024-06-09 NOTE — Progress Notes (Unsigned)
 Name: SUEELLEN KAYES  MRN/ DOB: [id number], [birth year]    Age/ Sex: 78 y.o., female    PCP: Hugh Charleston, MD (Inactive)   Reason for Endocrinology Evaluation: Hyperthyroidism     Date of Initial Endocrinology Evaluation: 01/25/2022    HPI: [name] is a 78 y.o. female with a past medical history of HTN. The patient presented for initial endocrinology clinic visit on 01/25/2022 for consultative assistance with her Hyperthyroidism.   During evaluation for weight loss in September 2022 she was noted with hyperthyroidism with a suppressed TSH at 0.08 uIU/mL and elevated free T4 at 1.39 (0.61-1.12) and undetectable anti-TPO antibodies.    An uptake and scan was performed on 09/06/2021 revealing normal uptake at 24% of I-123 with a left lobe cold nodule. Thyroid  ultrasound showed a complex cystic area on the left lobe which was biopsied in 2017 with benign cytology.   On her initial visit to our clinic she had an undetectable TRAb  Repeat thyroid  ultrasound on November 17, 2021 revealed heterogeneous thyroid  gland without discrete nodules.  On her initial visit to our clinic her TSH was low at 0.19 u U IU/mL, normal T3 and free T4.  She was started on methimazole  at the time  No prior exposure to radiation  No prior dx of osteoporosis nor cardiac arrythmia pp  Sister with hypothyroidism   SUBJECTIVE:     Today (06/09/24): Ms. Mendell is here for follow-up on multinodular goiter and hyperthyroidism.  Weight continues to fluctuate Denies local neck symptoms Denies palpitations No constipation or diarrhea  Denies tremors No anxiety or depression  No Biotin intake    Methimazole  5 mg, half a tablet on Saturday, none on Sundays, and 1 tablet the rest of the week    HISTORY:  Past Medical History:  Past Medical History:  Diagnosis Date   Arthritis    Hypertension    Hyperthyroidism    Past Surgical History:  Past Surgical History:  Procedure Laterality  Date   cataract Bilateral    2022   CESAREAN SECTION     x2   CHOLECYSTECTOMY N/A 11/22/2016   Procedure: LAPAROSCOPIC CHOLECYSTECTOMY;  Surgeon: James Wyatt, MD;  Location: [facility];  Service: General;  Laterality: N/A;   COLONOSCOPY     20 22   HIP ARTHROSCOPY Right    x2   KNEE ARTHROPLASTY Left    2022   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 Her smoking use included cigarettes. She has never used smokeless tobacco. She reports that she does not drink alcohol and does not use drugs. Family History: family history is not on file.   HOME MEDICATIONS: Allergies as of 06/09/2024       Reactions   Altace [ramipril]    Tongue swelling   Vytorin [ezetimibe-simvastatin] Anaphylaxis   Gabapentin    Neosporin [neomycin-bacitracin Zn-polymyx] Other (See Comments)   Eye burning   Sulfa Antibiotics    unknown   Tramadol Hcl    Triamterene    Cramps   Zetia [ezetimibe] Other (See Comments)   Chest pain   Statins Rash        Medication List        Accurate as of June 09, 2024  8:14 AM. If you have any questions,  ask your nurse or doctor.          amLODipine  10 MG tablet Commonly known as: NORVASC  Take 10 mg by mouth daily.   Boostrix 5-2.5-18.5 LF-MCG/0.5 injection Generic drug: Tdap   Calcium-Vitamin D3 600-400 MG-UNIT Caps Take 1 tablet by mouth daily.   fenofibrate  160 MG tablet Take 160 mg by mouth daily.   methimazole  5 MG tablet Commonly known as: TAPAZOLE  Take 1 tablet (5 mg total) by mouth as directed. Half a tablet on Saturday, none on Sundays, and 1 tablet the rest of the week   multivitamin with minerals tablet Take 1 tablet by mouth daily.          REVIEW OF SYSTEMS: A comprehensive ROS was conducted with the patient and is  negative except as per HPI    OBJECTIVE:  VS: BP 128/74 (BP Location: Left Arm, Patient Position: Sitting, Cuff Size: Large)   Pulse (!) 104   Ht 4' 11 (1.499 m)   Wt 158 lb (71.7 kg)   SpO2 95%   BMI 31.91 kg/m    Wt Readings from Last 3 Encounters:  06/09/24 158 lb (71.7 kg)  12/06/23 156 lb 12.8 oz (71.1 kg)  08/22/23 159 lb (72.1 kg)     EXAM: General: Pt appears well and is in NAD  Neck: General: Supple without adenopathy. Thyroid : No goiter or nodules appreciated  Lungs: Clear with good BS bilat   Heart: Auscultation: RRR.  Extremities:  BL LE: No pretibial edema   Mental Status: Judgment, insight: Intact Orientation: Oriented to time, place, and person Mood and affect: No depression, anxiety, or agitation     DATA REVIEWED:   Latest Reference Range & Units 06/09/24 08:27  TSH 0.40 - 4.50 mIU/L 1.66  T4,Free(Direct) 0.8 - 1.8 ng/dL 1.4      Latest Reference Range & Units 12/06/23 08:32  TSH 0.40 - 4.50 mIU/L 2.04  T4,Free(Direct) 0.8 - 1.8 ng/dL 1.3     Thyroid  uptake and scan 09/06/2021  A cold nodule is identified within the left thyroid  lobe within the mid to upper pole. Otherwise uniform distribution of radiotracer.   4 hour I-123 uptake = 12% (normal 5-20%)   24 hour I-123 uptake = 24% (normal 10-30%)   IMPRESSION: Normal 4 and 24 hour iodine  uptake.   Cold nodule within the left thyroid  lobe. Correlation with dedicated thyroid  sonography is recommended.  Thyroid  ultrasound 11/17/2021  I personally examined the patient with ultrasound. Multiple pseudo nodules throughout the right thyroid  lobe without a discrete right thyroid  nodule.   Left thyroid  lobe is markedly enlarged. There are complex cystic areas within the midportion of the left thyroid  lobe with likely accounts for the cold nodule on the thyroid  scan. This cystic area was present in 2017 but there is increased fluid compared to 2017. There is not a discrete cystic nodule in  this area but this area is most compatible with a TR 2 nodule since there is mixed cystic and solid composition with isoechoic echogenicity. The left mid thyroid  lobe cystic area does not meet criteria for biopsy or dedicated follow-up. Again noted is markedly heterogeneous tissue in the left inferior thyroid  lobe that corresponds with the previously biopsied area.   IMPRESSION: 1. Thyroid  is enlarged and diffusely heterogeneous without discrete suspicious nodules. The cold area on the previous thyroid  scan likely corresponds with the complex cystic area within the left mid thyroid  lobe. No areas meet criteria for ultrasound-guided biopsy at this time.  FNA left lobe 12/21/2015  THYROID , LEFT LOBE, FINE NEEDLE ASPIRATION (SPECIMEN 1 OF 1, COLLECTED ON 12/21/2015): CONSISTENT WITH BENIGN FOLLICULAR NODULE (BETHESDA CATEGORY II). ROBERT HILLARD ASSESSMENT/PLAN/RECOMMENDATIONS:   Hyperthyroidism    -Patient is clinically euthyroid -No local neck symptoms  -Thyroid  uptake and scan showed increased uptake at the upper lobes bilaterally -TFTs remain within normal range will decrease methimazole  as below  Medications : Continue methimazole  5 mg, 1 tablet Monday through Friday, none on Saturdays or Sundays   Follow-up in 6 months   Signed electronically by: Stefano Redgie Butts, MD  [facility] Endocrinology  [facility] [address] [place] [place], [place] [address] Phone: [phone] FAX: [phone]   CC: Hugh Charleston, MD (Inactive) [address] [place] [address] Phone: [phone] Fax: [phone]   Return to Endocrinology clinic as below: No future appointments.

## 2024-06-10 ENCOUNTER — Ambulatory Visit: Payer: Self-pay | Admitting: Internal Medicine

## 2024-06-10 MED ORDER — METHIMAZOLE 5 MG PO TABS: 5.0000 mg | ORAL_TABLET | ORAL | 3 refills | Status: AC

## 2024-07-01 DIAGNOSIS — M810 Age-related osteoporosis without current pathological fracture: Secondary | ICD-10-CM | POA: Diagnosis not present

## 2024-07-01 DIAGNOSIS — E785 Hyperlipidemia, unspecified: Secondary | ICD-10-CM | POA: Diagnosis not present

## 2024-07-01 DIAGNOSIS — I10 Essential (primary) hypertension: Secondary | ICD-10-CM | POA: Diagnosis not present

## 2024-07-22 DIAGNOSIS — E785 Hyperlipidemia, unspecified: Secondary | ICD-10-CM | POA: Diagnosis not present

## 2024-07-22 DIAGNOSIS — R000 Tachycardia, unspecified: Secondary | ICD-10-CM | POA: Diagnosis not present

## 2024-07-22 DIAGNOSIS — E0590 Thyrotoxicosis, unspecified without thyrotoxic crisis or storm: Secondary | ICD-10-CM | POA: Diagnosis not present

## 2024-07-22 DIAGNOSIS — Z0000 Encounter for general adult medical examination without abnormal findings: Secondary | ICD-10-CM | POA: Diagnosis not present

## 2024-07-22 DIAGNOSIS — M810 Age-related osteoporosis without current pathological fracture: Secondary | ICD-10-CM | POA: Diagnosis not present

## 2024-07-22 DIAGNOSIS — M899 Disorder of bone, unspecified: Secondary | ICD-10-CM | POA: Diagnosis not present

## 2024-07-22 DIAGNOSIS — R7301 Impaired fasting glucose: Secondary | ICD-10-CM | POA: Diagnosis not present

## 2024-07-22 DIAGNOSIS — I10 Essential (primary) hypertension: Secondary | ICD-10-CM | POA: Diagnosis not present

## 2024-07-22 DIAGNOSIS — R946 Abnormal results of thyroid function studies: Secondary | ICD-10-CM | POA: Diagnosis not present

## 2024-08-01 DIAGNOSIS — I10 Essential (primary) hypertension: Secondary | ICD-10-CM | POA: Diagnosis not present

## 2024-08-01 DIAGNOSIS — E785 Hyperlipidemia, unspecified: Secondary | ICD-10-CM | POA: Diagnosis not present

## 2024-08-01 DIAGNOSIS — M810 Age-related osteoporosis without current pathological fracture: Secondary | ICD-10-CM | POA: Diagnosis not present

## 2024-08-21 DIAGNOSIS — Z96643 Presence of artificial hip joint, bilateral: Secondary | ICD-10-CM | POA: Diagnosis not present

## 2024-08-21 DIAGNOSIS — Z96652 Presence of left artificial knee joint: Secondary | ICD-10-CM | POA: Diagnosis not present

## 2024-08-21 DIAGNOSIS — Z96642 Presence of left artificial hip joint: Secondary | ICD-10-CM | POA: Diagnosis not present

## 2024-08-21 DIAGNOSIS — Z96653 Presence of artificial knee joint, bilateral: Secondary | ICD-10-CM | POA: Diagnosis not present

## 2024-08-21 DIAGNOSIS — Z471 Aftercare following joint replacement surgery: Secondary | ICD-10-CM | POA: Diagnosis not present

## 2024-08-21 DIAGNOSIS — Z96641 Presence of right artificial hip joint: Secondary | ICD-10-CM | POA: Diagnosis not present

## 2024-08-31 DIAGNOSIS — E785 Hyperlipidemia, unspecified: Secondary | ICD-10-CM | POA: Diagnosis not present

## 2024-08-31 DIAGNOSIS — M810 Age-related osteoporosis without current pathological fracture: Secondary | ICD-10-CM | POA: Diagnosis not present

## 2024-08-31 DIAGNOSIS — I10 Essential (primary) hypertension: Secondary | ICD-10-CM | POA: Diagnosis not present

## 2024-12-08 ENCOUNTER — Ambulatory Visit: Admitting: Internal Medicine
